# Patient Record
Sex: Female | Born: 1990 | Race: White | Hispanic: No | Marital: Married | Smoking: Former smoker
Health system: Southern US, Community
[De-identification: ages and names within clinical notes are randomized; demographics above are authoritative.]

## PROBLEM LIST (undated history)

## (undated) ENCOUNTER — Inpatient Hospital Stay (HOSPITAL_COMMUNITY): Payer: Self-pay

## (undated) DIAGNOSIS — F99 Mental disorder, not otherwise specified: Secondary | ICD-10-CM

## (undated) DIAGNOSIS — A6000 Herpesviral infection of urogenital system, unspecified: Secondary | ICD-10-CM

## (undated) HISTORY — PX: NO PAST SURGERIES: SHX2092

## (undated) HISTORY — DX: Mental disorder, not otherwise specified: F99

## (undated) HISTORY — DX: Herpesviral infection of urogenital system, unspecified: A60.00

---

## 2015-07-13 ENCOUNTER — Other Ambulatory Visit (HOSPITAL_COMMUNITY): Payer: Self-pay | Admitting: Unknown Physician Specialty

## 2015-07-13 ENCOUNTER — Ambulatory Visit (HOSPITAL_COMMUNITY)
Admission: RE | Admit: 2015-07-13 | Discharge: 2015-07-13 | Disposition: A | Discharge status: Home / Self Care | Payer: Self-pay | Source: Ambulatory Visit | Attending: Unknown Physician Specialty | Admitting: Unknown Physician Specialty

## 2015-07-13 ENCOUNTER — Encounter (HOSPITAL_COMMUNITY): Payer: Self-pay

## 2015-07-13 DIAGNOSIS — Z315 Encounter for genetic counseling: Secondary | ICD-10-CM | POA: Insufficient documentation

## 2015-07-13 DIAGNOSIS — IMO0001 Reserved for inherently not codable concepts without codable children: Secondary | ICD-10-CM

## 2015-07-13 DIAGNOSIS — Z3689 Encounter for other specified antenatal screening: Secondary | ICD-10-CM

## 2015-07-13 DIAGNOSIS — Z3A16 16 weeks gestation of pregnancy: Secondary | ICD-10-CM

## 2015-07-13 DIAGNOSIS — O358XX0 Maternal care for other (suspected) fetal abnormality and damage, not applicable or unspecified: Principal | ICD-10-CM

## 2015-07-13 DIAGNOSIS — O283 Abnormal ultrasonic finding on antenatal screening of mother: Secondary | ICD-10-CM | POA: Insufficient documentation

## 2015-07-13 DIAGNOSIS — O351XX0 Maternal care for (suspected) chromosomal abnormality in fetus, not applicable or unspecified: Secondary | ICD-10-CM

## 2015-07-13 DIAGNOSIS — O289 Unspecified abnormal findings on antenatal screening of mother: Secondary | ICD-10-CM

## 2015-07-13 DIAGNOSIS — O3510X0 Maternal care for (suspected) chromosomal abnormality in fetus, unspecified, not applicable or unspecified: Secondary | ICD-10-CM

## 2015-07-15 DIAGNOSIS — IMO0002 Reserved for concepts with insufficient information to code with codable children: Secondary | ICD-10-CM | POA: Insufficient documentation

## 2015-07-15 DIAGNOSIS — O351XX0 Maternal care for (suspected) chromosomal abnormality in fetus, not applicable or unspecified: Secondary | ICD-10-CM | POA: Insufficient documentation

## 2015-07-15 DIAGNOSIS — O3510X0 Maternal care for (suspected) chromosomal abnormality in fetus, unspecified, not applicable or unspecified: Secondary | ICD-10-CM | POA: Insufficient documentation

## 2015-07-15 NOTE — Progress Notes (Signed)
Genetic Counseling  High-Risk Gestation Note  Appointment Date:  07/13/2015 Referred By: [name], MD Date of Birth:  1990/12/28 Partner:  [name]   Pregnancy History: [id number] Estimated Date of Delivery: 12/24/15 Estimated Gestational Age: 47w4dAttending: MRenella Cunas MD   I met with Ms. COple Girgisand her partner, Mr. JMartiniqueBarker, for genetic counseling because of abnormal ultrasound findings and high risk Turner syndrome (monosomy X) result from noninvasive prenatal screening (NIPS)/cell free DNA testing.  In Summary:   Cystic hygroma visualized in first trimester at patient's OB office and NIPS (Panorama) performed at that time  Panorama result consistent with high risk for monosomy X (Turner syndrome)  Ultrasound today visualized very large cystic hygroma with hydrops: bilateral pleural effusions with mediastinal shift, ascites, generalized skin edema, single umbilical artery and echogenic bowel  Couple declined additional chromosome testing (amniocentesis) at this time; They are aware that postnatal karyotype analysis (including POC testing) could be performed  Discussed very poor prognosis given ultrasound findings and given that approximately 99% of pregnancies with Turnery syndrome result in first or second trimester loss  Couple understandably very upset by information today  Plan to follow-up with OB provider  We discussed the results of ultrasound today. Ultrasound visualized very large cystic hygroma with hydrops: bilateral pleural effusions with mediastinal shift, ascites, generalized skin edema, single umbilical artery and echogenic bowel. Complete ultrasound results are reported separately. A cystic hygroma describes a septated fluid filled sac at the back of the neck that typically results from failure of the fetal lymphatic system.  Various etiologies for a hygroma including normal variation (immature lymphatic system), a chromosome condition,  single gene condition, or a congenital anomaly (heart defect). Cystic hygromas can progress to hydrops fetalis, which is associated with a very poor prognosis and likely fetal demise. We reviewed that given the ultrasound findings today, fetal demise is very likely for the pregnancy.   Ms. CFredrickpreviously had noninvasive prenatal screening (NIPS)/cell free DNA testing performed through her OB office. These results[facility], were consistent with a high risk for monosomy X (Turner syndrome). NIPS assesses cell free DNA that is placental in origin and while highly specific and sensitive, it is not considered diagnostic. However, given the ultrasound findings, the positive predictive value of this result is estimated to be 99%.   We briefly reviewed chromosomes. The patient declined detailed discussion at this time. They understand that Turner syndrome occurs when a girl receives only one copy of an X chromosome instead of the expected two copies. The missing X chromosome is present from the time of conception, and could have resulted from maternal meiotic nondisjunction, or more commonly (80%) from paternal nondisjunction. We discussed that Turner syndrome is a sporadic condition and is not the result of anything that a parent did or did not do. Turner syndrome is a multisystem disorder occuring in approximately 1 in 238,000female live births. Because typically every cell of the body is missing an X chromosome, virtually all organ systems can be affected. Lymphedema, cardiac and renal anomalies, short stature, developmental delay, and infertility due to ovarian dysfunction are commonly described in girls with this condition.   We discussed that pregnancies with Turner syndrome have an increased risk for miscarriage, most often in the first or second trimester, or stillbirth (>99% of cases). The risk for neonatal death is also increased.   We briefly discussed that NIPS does not  distinguish between different types of Turner syndrome. This couple was  briefly counseled regarding the availability of diagnostic testing via amniocentesis or postnatal confirmation. [name] declined amniocentesis. Options for the pregnancy include termination of pregnancy or continuing the pregnancy with expectant management. The couple planned to continue to monitor the pregnancy and plan to follow-up with her OB.   Once a couple has had one pregnancy with Turner syndrome, the risk of recurrence for a future pregnancy does not appear to be increased above the risk of the general population. Additionally, the chance to have a pregnancy with Turner syndrome does not increase with maternal age as it does with other extra chromosome conditions, such as Down syndrome. To summarize, this couple was counseled that they likely do not have an increased risk to have another pregnancy with Turner syndrome, if the diagnosis is confirmed in the current pregnancy. However, chromosome analysis is needed to confirm the diagnosis and determine whether it was likely sporadic or due to a different underlying mechanism.   The patient declined detailed family history intake and discussion today given the nature of today's visit and given that the couple was understandably upset today. She reported no family history of birth defects or known genetic conditions. Without further information regarding the provided family history, an accurate genetic risk cannot be calculated. Further genetic counseling is warranted if more information is obtained.  Carolyn Coffey was provided with written information regarding cystic fibrosis (CF) including the carrier frequency and incidence in the Caucasian population, the availability of carrier testing and prenatal diagnosis if indicated.  In addition, we discussed that CF is routinely screened for as part of the [place] newborn screening panel.  She declined discussion of CF carrier  screening today.   Carolyn Coffey denied exposure to environmental toxins or chemical agents. She denied the use of alcohol, tobacco or street drugs. She denied significant viral illnesses during the course of her pregnancy. Her medical and surgical histories were noncontributory.   I counseled this couple regarding the above risks and available options.  The approximate face-to-face time with the genetic counselor was 25 minutes.  Chipper Oman, MS Certified Genetic Counselor 07/15/2015

## 2015-07-16 ENCOUNTER — Encounter (HOSPITAL_COMMUNITY): Payer: Self-pay

## 2015-07-16 ENCOUNTER — Other Ambulatory Visit (HOSPITAL_COMMUNITY): Payer: Self-pay

## 2016-05-17 ENCOUNTER — Encounter (HOSPITAL_COMMUNITY): Payer: Self-pay

## 2017-10-19 IMAGING — US US MFM OB DETAIL+14 WK
1 series · 14 of 28 positions shown · non-contrast
Comparison: none

[Series 2: us mfm ob detail+14 wk · 57 acquisitions, 14 frames shown]
[im 3/57]
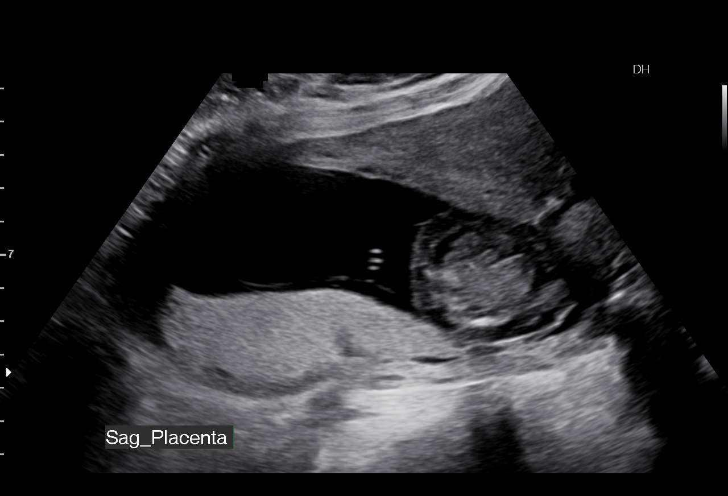
[im 7/57]
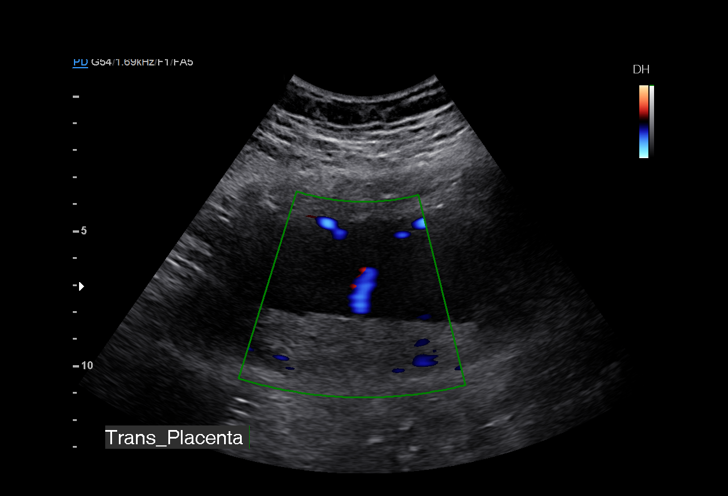
[im 11/57]
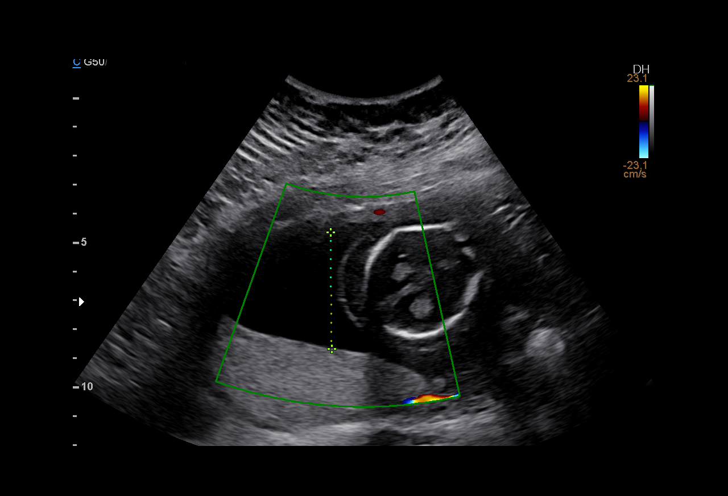
[im 15/57]
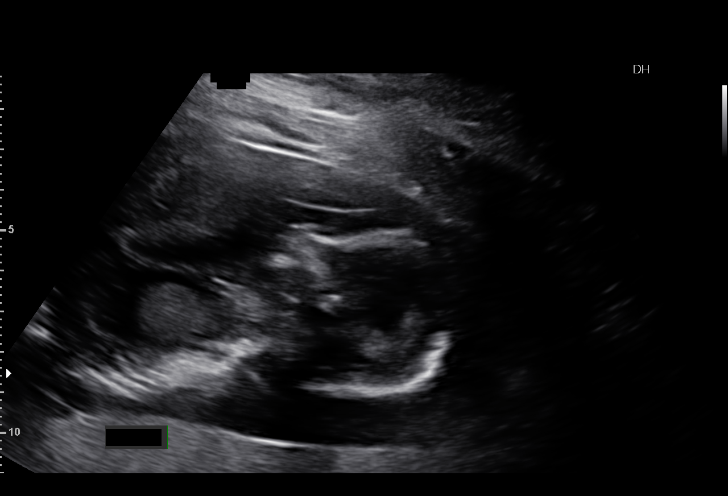
[im 19/57]
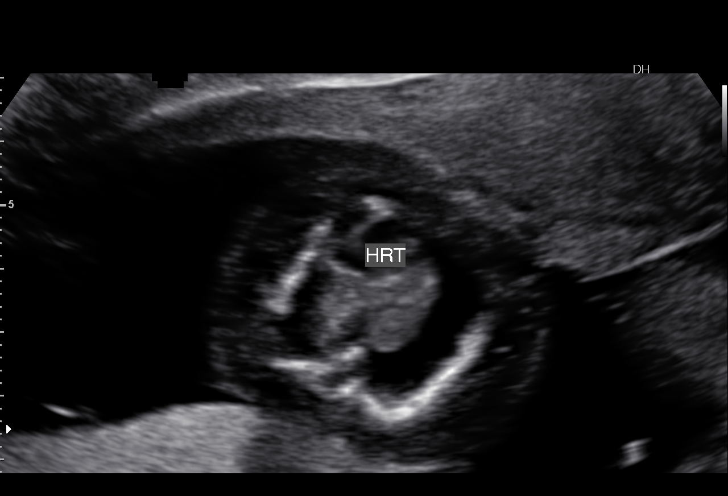
[im 23/57]
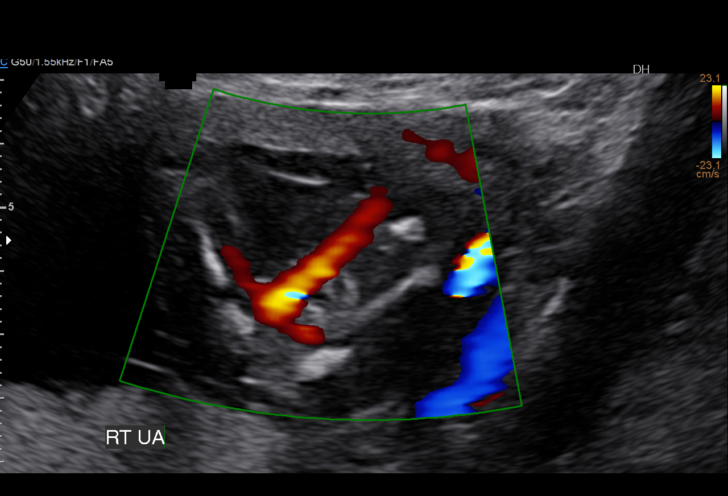
[im 27/57]
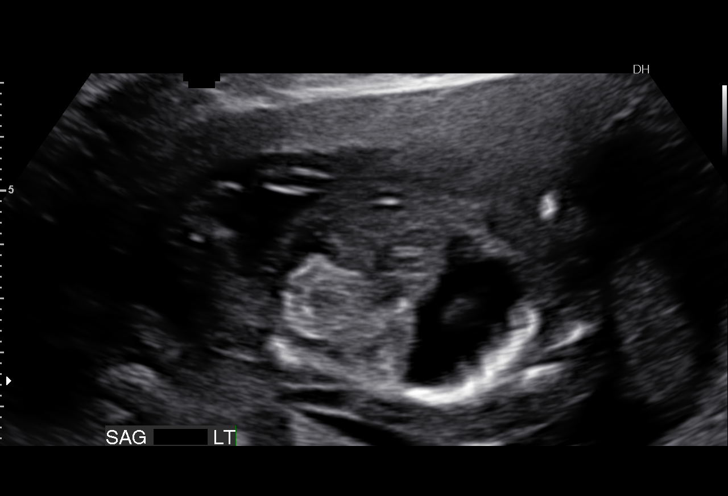
[im 32/57]
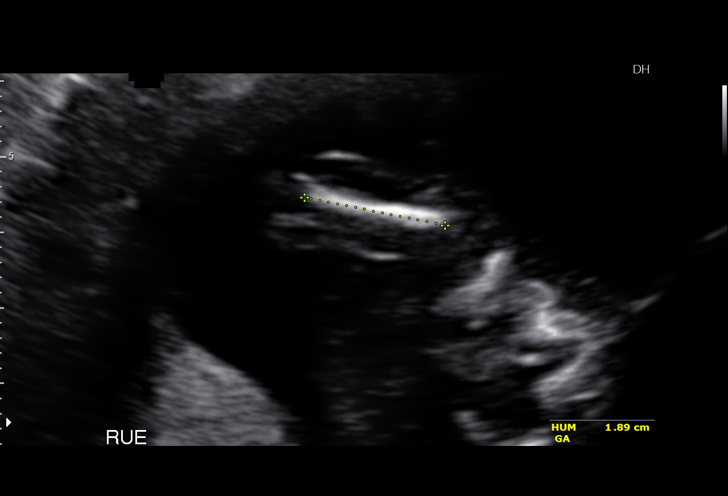
[im 36/57]
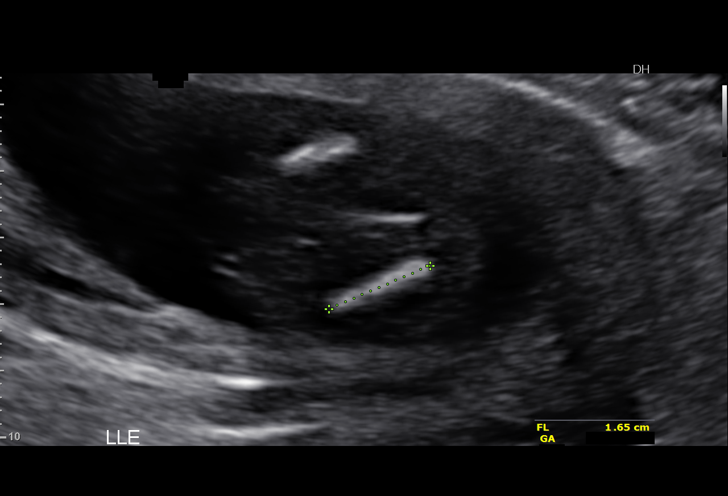
[im 40/57]
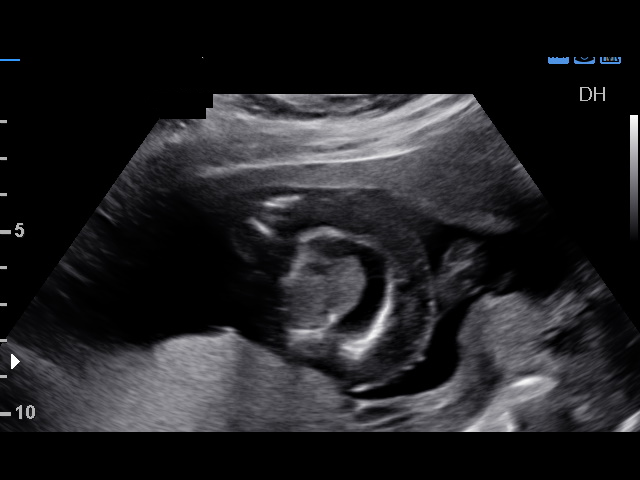
[im 44/57]
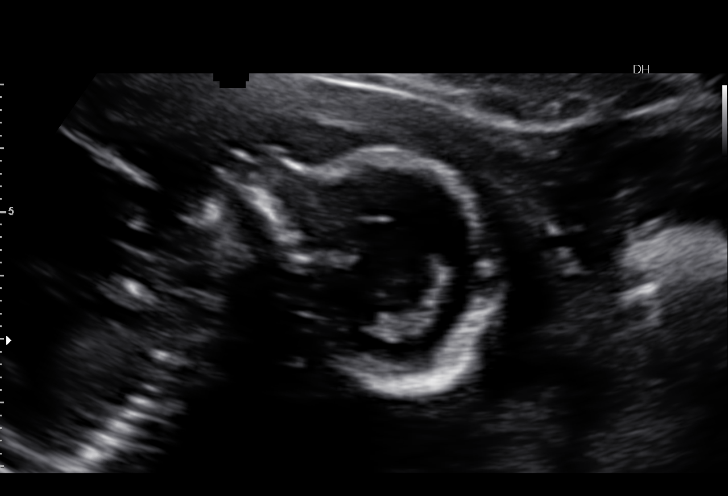
[im 48/57]
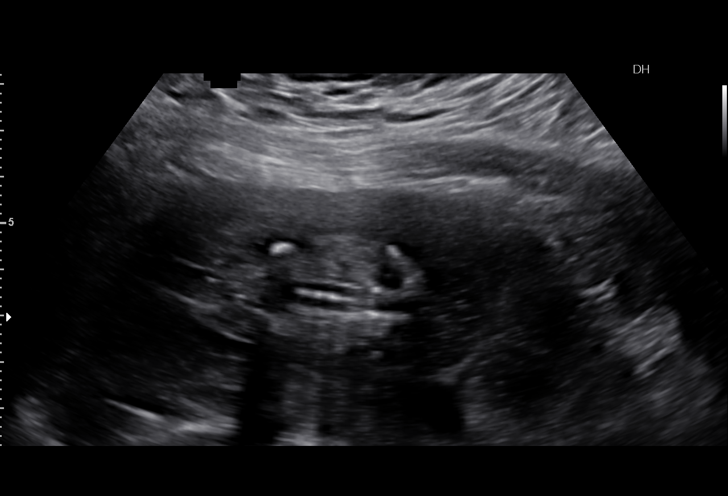
[im 52/57]
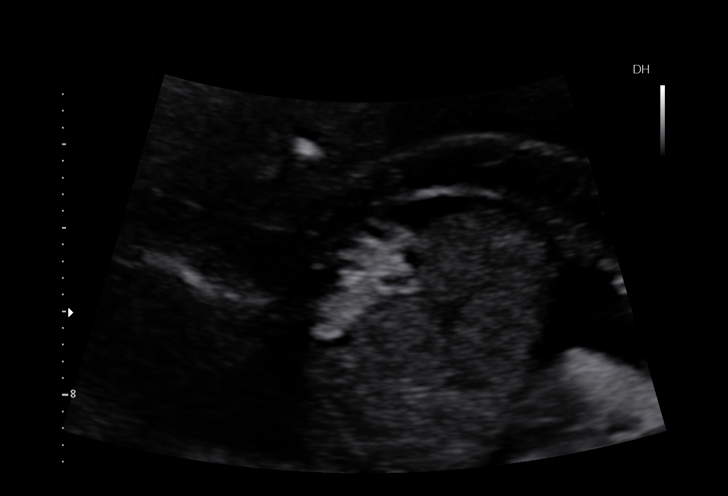
[im 57/57]
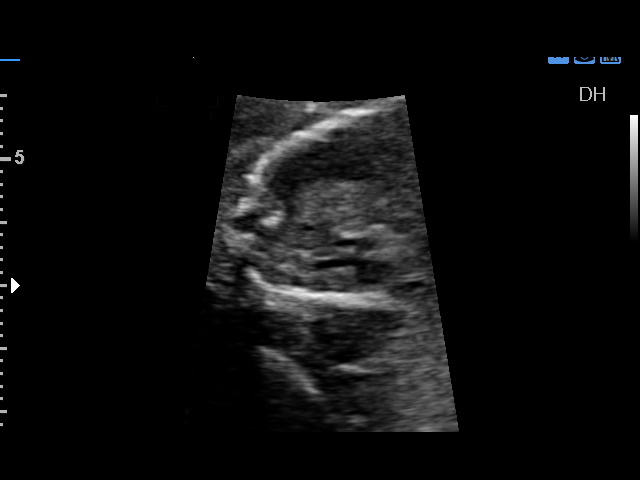

[14 of 28 positions shown; findings below may reference images not displayed]

am)

Date:

[REDACTED] 03033

1  Sericka Loveable           [id number]       [id number]     [id number]
Indications

16 weeks gestation of pregnancy
Detailed fetal anatomic survey                  Z36
Abnormal finding on antenatal screening;
high risk by NIPS for 45X
Cystic hygroma
OB History

Blood Type:    A-
Gravidity:     3         Term:  2        Prem:    0        SAB:   0
TOP:           0       Ectopic  0        Living:  2
:
Fetal Evaluation

Num Of Fetuses:      1
Fetal Heart          161
Rate(bpm):
Cardiac Activity:    Observed
Presentation:        Transverse, head to maternal left
Placenta:            Posterior, above cervical os
P. Cord Insertion:   Marginal insertion

Amniotic Fluid
AFI FV:      Subjectively within normal limits
Larg Pckt:      4.0  cm
Biometry

BPD:      37.3  mm     G. Age:   17w 3d                  CI:        80.18   %    70 - 86
FL/HC:      12.5   %    14.6 -
HC:      131.6  mm     G. Age:   16w 5d        48   %    HC/AC:      0.86        1.07 -
AC:      152.4  mm     G. Age:   20w 3d      > 97   %    FL/BPD      44.2   %
:
FL:       16.5  mm     G. Age:   14w 6d       < 3   %    FL/AC:      10.8   %    20 - 24
HUM:      19.4  mm     G. Age:   15w 5d        30   %
CER:      15.1  mm     G. Age:   15w 1d        17   %
CM:        2.4  mm

Est.         206   gm    0 lb 7 oz    > 75   %
FW:
Gestational Age

LMP:           16w 4d        Date:  03/19/15                  EDD:   12/24/15
U/S Today:     17w 3d                                         EDD:   12/18/15
Best:          16w 4d    Det. By:   LMP  (03/19/15)           EDD:   12/24/15
Anatomy

Cranium:          Appears normal         Aortic Arch:       Not well visualized
Fetal Cavum:      Not well visualized    Ductal Arch:       Not well visualized
Ventricles:       Appears normal         Diaphragm:         Not well visualized
Choroid Plexus:   Appears normal         Stomach:           Appears normal,
left sided
Cerebellum:       Appears normal         Abdomen:           Echogenic Bowel
Posterior         Appears normal         Abdominal          Not well visualized
Fossa:                                   Wall:
Nuchal Fold:      Cystic hygroma         Cord Vessels:      2 vessel cord,
absent FAXX TIGER
Jimmy Charania
Face:             Profile nl; orbits not Kidneys:           Appear normal
well visualized
Lips:             Not well visualized    Bladder:           Not well visualized
Fetal Thoracic:   Pleural effusion       Spine:             Not well visualized
Heart:            Not well visualized    Upper              Visualized
Extremities:
RVOT:             Not well visualized    Lower              Visualized
Extremities:
LVOT:             Not well visualized

Other:   Technically difficult due to early gestational age.
Cervix Uterus Adnexa

Cervix
Length:             3.3  cm.
Normal appearance by transabdominal scan.

Left Ovary
Within normal limits.
Right Ovary
Within normal limits.

Adnexa:       No abnormality visualized.
Impression

SIUP at 16+4 weeks
Very large cystic hygroma with hydrops: bilateral pleural
effusions with mediastinal shift, ascites, generalized skin
edema, single umbilical artery and echogenic bowel
Normal amniotic fluid volume
Measurements consistent with LMP dating

The US findings were shared with Ms. Ullman. The
implications of the above abnormalities were discussed in
detail. Unfortunately, the prognosis is dismal. She also
received genetic counseling. All of her prenatal testing and
pregnancy management options were reviewed. After
careful consideration, she declined amniocentesis and all
other testing. She plans to continue the pregnancy until the
fetus dies.
Recommendations

Follow-up ultrasounds as clinically indicated.

## 2018-01-02 ENCOUNTER — Ambulatory Visit (INDEPENDENT_AMBULATORY_CARE_PROVIDER_SITE_OTHER): Payer: Self-pay | Admitting: Adult Health

## 2018-01-02 ENCOUNTER — Other Ambulatory Visit: Payer: Self-pay

## 2018-01-02 ENCOUNTER — Encounter: Payer: Self-pay | Admitting: Adult Health

## 2018-01-02 VITALS — BP 130/77 | HR 78 | Ht 70.0 in | Wt 263.0 lb

## 2018-01-02 DIAGNOSIS — Z3A15 15 weeks gestation of pregnancy: Secondary | ICD-10-CM

## 2018-01-02 DIAGNOSIS — Z3201 Encounter for pregnancy test, result positive: Secondary | ICD-10-CM | POA: Insufficient documentation

## 2018-01-02 DIAGNOSIS — Z8759 Personal history of other complications of pregnancy, childbirth and the puerperium: Secondary | ICD-10-CM

## 2018-01-02 DIAGNOSIS — Z363 Encounter for antenatal screening for malformations: Secondary | ICD-10-CM | POA: Insufficient documentation

## 2018-01-02 DIAGNOSIS — N926 Irregular menstruation, unspecified: Secondary | ICD-10-CM

## 2018-01-02 LAB — POCT URINE PREGNANCY: Preg Test, Ur: POSITIVE — AB

## 2018-01-02 NOTE — Patient Instructions (Signed)
Second Trimester of Pregnancy The second trimester is from week 13 through week 28, month 4 through 6. This is often the time in pregnancy that you feel your best. Often times, morning sickness has lessened or quit. You may have more energy, and you may get hungry more often. Your unborn baby (fetus) is growing rapidly. At the end of the sixth month, he or she is about 9 inches long and weighs about 1 pounds. You will likely feel the baby move (quickening) between 18 and 20 weeks of pregnancy. Follow these instructions at home:  Avoid all smoking, herbs, and alcohol. Avoid drugs not approved by your doctor.  Do not use any tobacco products, including cigarettes, chewing tobacco, and electronic cigarettes. If you need help quitting, ask your doctor. You may get counseling or other support to help you quit.  Only take medicine as told by your doctor. Some medicines are safe and some are not during pregnancy.  Exercise only as told by your doctor. Stop exercising if you start having cramps.  Eat regular, healthy meals.  Wear a good support bra if your breasts are tender.  Do not use hot tubs, steam rooms, or saunas.  Wear your seat belt when driving.  Avoid raw meat, uncooked cheese, and liter boxes and soil used by cats.  Take your prenatal vitamins.  Take 1500-2000 milligrams of calcium daily starting at the 20th week of pregnancy until you deliver your baby.  Try taking medicine that helps you poop (stool softener) as needed, and if your doctor approves. Eat more fiber by eating fresh fruit, vegetables, and whole grains. Drink enough fluids to keep your pee (urine) clear or pale yellow.  Take warm water baths (sitz baths) to soothe pain or discomfort caused by hemorrhoids. Use hemorrhoid cream if your doctor approves.  If you have puffy, bulging veins (varicose veins), wear support hose. Raise (elevate) your feet for 15 minutes, 3-4 times a day. Limit salt in your diet.  Avoid heavy  lifting, wear low heals, and sit up straight.  Rest with your legs raised if you have leg cramps or low back pain.  Visit your dentist if you have not gone during your pregnancy. Use a soft toothbrush to brush your teeth. Be gentle when you floss.  You can have sex (intercourse) unless your doctor tells you not to.  Go to your doctor visits. Get help if:  You feel dizzy.  You have mild cramps or pressure in your lower belly (abdomen).  You have a nagging pain in your belly area.  You continue to feel sick to your stomach (nauseous), throw up (vomit), or have watery poop (diarrhea).  You have bad smelling fluid coming from your vagina.  You have pain with peeing (urination). Get help right away if:  You have a fever.  You are leaking fluid from your vagina.  You have spotting or bleeding from your vagina.  You have severe belly cramping or pain.  You lose or gain weight rapidly.  You have trouble catching your breath and have chest pain.  You notice sudden or extreme puffiness (swelling) of your face, hands, ankles, feet, or legs.  You have not felt the baby move in over an hour.  You have severe headaches that do not go away with medicine.  You have vision changes. This information is not intended to replace advice given to you by your health care provider. Make sure you discuss any questions you have with your health care   provider. Document Released: 09/06/2009 Document Revised: 11/18/2015 Document Reviewed: 08/13/2012 Elsevier Interactive Patient Education  2017 Elsevier Inc.  

## 2018-01-02 NOTE — Progress Notes (Signed)
  Subjective:     Patient ID: Carolyn Coffey, female   DOB: Nov 15, 1990, 27 y.o.   MRN: [id number]  HPI Carolyn Coffey is a 27 year old, white female, single, in for UPT, has missed a period and had +HPTs.She had still born in 2017 at 26 weeks, that had Turner's Syndrome.   Review of Systems +missed period with 3-4 +HPTs Has abscessed tooth, on Amoxil and has norco if needed  Reviewed past medical,surgical, social and family history. Reviewed medications and allergies.     Objective:   Physical Exam BP 130/77 (BP Location: Left Arm, Patient Position: Sitting, Cuff Size: Large)   Pulse 78   Ht 5\' 10"  (1.778 m)   Wt 263 lb (119.3 kg)   LMP 09/18/2017 (Approximate)   BMI 37.74 kg/m UPT +, about 15 +1 week by LMP with EDD 06/25/18.Skin warm and dry. Neck: mid line trachea, normal thyroid, good ROM, no lymphadenopathy noted. Lungs: clear to ausculation bilaterally. Cardiovascular: regular rate and rhythm.Abdomen is soft and non tender, FHR 141 via doppler. PHQ 2 score 0.  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     Assessment:     1. Positive pregnancy test   2. [redacted] weeks gestation of pregnancy   3. Antenatal screening for malformation using ultrasonics       Plan:     Continue PNV Return in about 2 weeks for anatomy US, if far enough along Review handouts on second trimester and by [facility]

## 2018-01-16 ENCOUNTER — Ambulatory Visit (INDEPENDENT_AMBULATORY_CARE_PROVIDER_SITE_OTHER): Payer: Self-pay

## 2018-01-16 DIAGNOSIS — Z363 Encounter for antenatal screening for malformations: Secondary | ICD-10-CM

## 2018-01-16 NOTE — Progress Notes (Signed)
US 25+6 wks,cephalic,cx 3.7 cm,normal ovaries bilat,posterior pl gr 1,svp of fluid 4.8 cm,fhr 140 bpm,efw 895 g,anatomy complete,no obvious abnormalities

## 2018-01-30 ENCOUNTER — Encounter: Payer: Self-pay | Admitting: Advanced Practice Midwife

## 2018-01-30 ENCOUNTER — Other Ambulatory Visit: Payer: Self-pay

## 2018-01-30 ENCOUNTER — Other Ambulatory Visit (HOSPITAL_COMMUNITY)
Admission: RE | Admit: 2018-01-30 | Discharge: 2018-01-30 | Disposition: A | Discharge status: Home / Self Care | Payer: Medicaid - Out of State | Source: Ambulatory Visit | Attending: Advanced Practice Midwife | Admitting: Advanced Practice Midwife

## 2018-01-30 ENCOUNTER — Ambulatory Visit (INDEPENDENT_AMBULATORY_CARE_PROVIDER_SITE_OTHER): Payer: Self-pay | Admitting: Advanced Practice Midwife

## 2018-01-30 VITALS — BP 123/75 | HR 77 | Wt 260.0 lb

## 2018-01-30 DIAGNOSIS — O0930 Supervision of pregnancy with insufficient antenatal care, unspecified trimester: Secondary | ICD-10-CM | POA: Insufficient documentation

## 2018-01-30 DIAGNOSIS — Z3482 Encounter for supervision of other normal pregnancy, second trimester: Secondary | ICD-10-CM | POA: Diagnosis present

## 2018-01-30 DIAGNOSIS — Z3490 Encounter for supervision of normal pregnancy, unspecified, unspecified trimester: Secondary | ICD-10-CM | POA: Insufficient documentation

## 2018-01-30 DIAGNOSIS — Z3A15 15 weeks gestation of pregnancy: Secondary | ICD-10-CM | POA: Insufficient documentation

## 2018-01-30 DIAGNOSIS — A6004 Herpesviral vulvovaginitis: Secondary | ICD-10-CM

## 2018-01-30 DIAGNOSIS — Z1151 Encounter for screening for human papillomavirus (HPV): Secondary | ICD-10-CM | POA: Diagnosis not present

## 2018-01-30 DIAGNOSIS — O282 Abnormal cytological finding on antenatal screening of mother: Secondary | ICD-10-CM | POA: Insufficient documentation

## 2018-01-30 DIAGNOSIS — Z124 Encounter for screening for malignant neoplasm of cervix: Secondary | ICD-10-CM

## 2018-01-30 DIAGNOSIS — O98312 Other infections with a predominantly sexual mode of transmission complicating pregnancy, second trimester: Secondary | ICD-10-CM

## 2018-01-30 DIAGNOSIS — Z131 Encounter for screening for diabetes mellitus: Secondary | ICD-10-CM

## 2018-01-30 DIAGNOSIS — Z23 Encounter for immunization: Secondary | ICD-10-CM

## 2018-01-30 DIAGNOSIS — Z1389 Encounter for screening for other disorder: Secondary | ICD-10-CM

## 2018-01-30 DIAGNOSIS — A6000 Herpesviral infection of urogenital system, unspecified: Secondary | ICD-10-CM | POA: Insufficient documentation

## 2018-01-30 DIAGNOSIS — Z331 Pregnant state, incidental: Secondary | ICD-10-CM

## 2018-01-30 DIAGNOSIS — Z3A26 26 weeks gestation of pregnancy: Secondary | ICD-10-CM

## 2018-01-30 LAB — POCT URINALYSIS DIPSTICK OB
Blood, UA: NEGATIVE
GLUCOSE, UA: NEGATIVE — AB
Ketones, UA: NEGATIVE
LEUKOCYTES UA: NEGATIVE
Nitrite, UA: NEGATIVE
POC,PROTEIN,UA: NEGATIVE

## 2018-01-30 NOTE — Patient Instructions (Signed)
Safe Medications in Pregnancy   Acne: Benzoyl Peroxide Salicylic Acid  Backache/Headache: Tylenol: 2 regular strength every 4 hours OR              2 Extra strength every 6 hours  Colds/Coughs/Allergies: Benadryl (alcohol free) 25 mg every 6 hours as needed Breath right strips Claritin Cepacol throat lozenges Chloraseptic throat spray Cold-Eeze- up to three times per day Cough drops, alcohol free Flonase (by prescription only) Guaifenesin Mucinex Robitussin DM (plain only, alcohol free) Saline nasal spray/drops Sudafed (pseudoephedrine) & Actifed ** use only after [redacted] weeks gestation and if you do not have high blood pressure Tylenol Vicks Vaporub Zinc lozenges Zyrtec   Constipation: Colace Ducolax suppositories Fleet enema Glycerin suppositories Metamucil Milk of magnesia Miralax Senokot Smooth move tea  Diarrhea: Kaopectate Imodium A-D  *NO pepto Bismol  Hemorrhoids: Anusol Anusol HC Preparation H Tucks  Indigestion: Tums Maalox Mylanta Zantac  Pepcid  Insomnia: Benadryl (alcohol free) 25mg  every 6 hours as needed Tylenol PM Unisom, no Gelcaps  Leg Cramps: Tums MagGel  Nausea/Vomiting:  Bonine Dramamine Emetrol Ginger extract Sea bands Meclizine  Nausea medication to take during pregnancy:  Unisom (doxylamine succinate 25 mg tablets) Take one tablet daily at bedtime. If symptoms are not adequately controlled, the dose can be increased to a maximum recommended dose of two tablets daily (1/2 tablet in the morning, 1/2 tablet mid-afternoon and one at bedtime). Vitamin B6 100mg  tablets. Take one tablet twice a day (up to 200 mg per day).  Skin Rashes: Aveeno products Benadryl cream or 25mg  every 6 hours as needed Calamine Lotion 1% cortisone cream  Yeast infection: Gyne-lotrimin 7 Monistat 7   **If taking multiple medications, please check labels to avoid duplicating the same active ingredients **take medication as directed on  the label ** Do not exceed 4000 mg of tylenol in 24 hours **Do not take medications that contain aspirin or ibuprof 1. Before your test, do not eat or drink anything for 8-10 hours prior to your  appointment (a small amount of water is allowed and you may take any medicines you normally take). Be sure to drink lots of water the day before. 2. When you arrive, your blood will be drawn for a 'fasting' blood sugar level.  Then you will be given a sweetened carbonated beverage to drink. You should  complete drinking this beverage within five minutes. After finishing the  beverage, you will have your blood drawn exactly 1 and 2 hours later. Having  your blood drawn on time is an important part of this test. A total of three blood  samples will be done. 3. The test takes approximately 2  hours. During the test, do not have anything to  eat or drink. Do not smoke, chew gum (not even sugarless gum) or use breath mints.  4. During the test you should remain close by and seated as much as possible and  avoid walking around. You may want to bring a book or something else to  occupy your time.  5. After your test, you may eat and drink as normal. You may want to bring a snack  to eat after the test is finished. Your provider will advise you as to the results of  this test and any follow-up if necessary  If your sugar test is positive for gestational diabetes, you will be given an phone call and further instructions discussed. If you wish to know all of your test results before your next appointment, feel  free to call the office, or look up your test results on Mychart.  (The range that the lab uses for normal values of the sugar test are not necessarily the range that is used for pregnant women; if your results are within the normal range, they are definitely normal.  However, if a value is deemed "high" by the lab, it may not be too high for a pregnant woman.  We will need to discuss the results if your  value(s) fall in the "high" category).     Tdap Vaccine  It is recommended that you get the Tdap vaccine during the third trimester of EACH pregnancy to help protect your baby from getting pertussis (whooping cough)  27-36 weeks is the BEST time to do this so that you can pass the protection on to your baby. During pregnancy is better than after pregnancy, but if you are unable to get it during pregnancy it will be offered at the hospital.  You will be offered this vaccine in the office after 27 weeks.  If you do not have health insurance, you can get the vaccine from the [facility] Department (no appointment needed).   Everyone who will be around your baby should also be up-to-date on their vaccines. Adults (who are not pregnant) only need 1 dose of Tdap during adulthood.

## 2018-01-30 NOTE — Progress Notes (Signed)
Subjective:    Carolyn Coffey is a Carolyn Coffey [redacted]w[redacted]d being seen today for her first obstetrical visit.  Her obstetrical history is significant for 2 early SABs, 3 FT (one macrosomic @ 9#10oz, denies GDM) SVDs, one PTD @ 27 weeks d/t IUFD/Turner's syndroms.  Pregnancy history fully reviewed. Late PNC, EDC based on 26 week [place] (2 months difference from LMP)  Patient reports ? BV sx (yellow dc).  Vitals:   01/30/18 1122  BP: 123/75  Pulse: 77  Weight: 260 lb (117.9 kg)    HISTORY: OB History  Gravida Para Term Preterm AB Living  7 4 3 1 2 3   SAB TAB Ectopic Multiple Live Births  2       3    # Outcome Date GA Lbr Len/2nd Weight Sex Delivery Anes PTL Lv  7 Current           6 Term 12/11/16   9 lb 10 oz (4.366 kg) M Vag-Spont   LIV  5 SAB 10/2015          4 Preterm 09/18/15 [redacted]w[redacted]d   F Vag-Spont  Y FD     Birth Comments: Turner Syndrome  3 SAB 2016          2 Term 10/18/11   7 lb 10 oz (3.459 kg) F Vag-Spont   LIV  1 Term 09/15/09   7 lb 5 oz (3.317 kg) F Vag-Spont   LIV   Past Medical History:  Diagnosis Date  . Genital herpes   . Mental disorder    anxiety   Past Surgical History:  Procedure Laterality Date  . NO PAST SURGERIES     Family History  Problem Relation Age of Onset  . Cancer Paternal Grandfather        skin  . Hypertension Paternal Grandmother   . Hypertension Maternal Grandmother   . Diabetes Maternal Grandfather   . Colon cancer Father   . Turner syndrome Daughter      Exam       Pelvic Exam:    Perineum: Normal Perineum   Vulva: normal   Vagina:  normal mucosa, normal appearing discharge, no palpable nodules   Uterus Normal, Gravid, FH: 30     Cervix: normal   Adnexa: Not palpable   Urinary:  urethral meatus normal    System:     Skin: normal coloration and turgor, no rashes    Neurologic: oriented, normal, normal mood   Extremities: normal strength, tone, and muscle mass   HEENT PERRLA   Mouth/Teeth mucous membranes moist, normal  dentition   Neck supple and no masses   Cardiovascular: regular rate and rhythm   Respiratory:  appears well, vitals normal, no respiratory distress, acyanotic   Abdomen: soft, non-tender;  FHR: 160        The nature of [facility] with multiple MDs and other Advanced Practice Providers was explained to patient; also emphasized that residents, students are part of our team.  Assessment:    Pregnancy: Carolyn Coffey Patient Active Problem List   Diagnosis Date Noted  . Supervision of normal pregnancy 01/30/2018  . Antenatal screening for malformation using ultrasonics 01/02/2018  . Suspected fetal chromosome anomaly affecting antepartum care of mother 07/15/2015  . Fetal cystic hygroma 07/15/2015        Plan:     Initial labs drawn. Continue prenatal vitamins  Problem list reviewed and updated  Reviewed n/v relief measures and warning s/s to report  Reviewed recommended weight gain based on pre-gravid BMI  Encouraged well-balanced diet Genetic Screening discussed CfDNA:  accepted .  Ultrasound discussed; fetal survey: results reviewed.  No follow-ups on file.  Jacklyn ShellFrances Cresenzo-Dishmon 01/30/2018

## 2018-01-31 LAB — PMP SCREEN PROFILE (10S), URINE
AMPHETAMINE SCREEN URINE: NEGATIVE ng/mL
BARBITURATE SCREEN URINE: NEGATIVE ng/mL
BENZODIAZEPINE SCREEN, URINE: NEGATIVE ng/mL
CANNABINOIDS UR QL SCN: NEGATIVE ng/mL
COCAINE(METAB.)SCREEN, URINE: NEGATIVE ng/mL
Creatinine(Crt), U: 42.4 mg/dL (ref 20.0–300.0)
METHADONE SCREEN, URINE: NEGATIVE ng/mL
OXYCODONE+OXYMORPHONE UR QL SCN: NEGATIVE ng/mL
Opiate Scrn, Ur: NEGATIVE ng/mL
PH UR, DRUG SCRN: 8.1 (ref 4.5–8.9)
PHENCYCLIDINE QUANTITATIVE URINE: NEGATIVE ng/mL
Propoxyphene Scrn, Ur: NEGATIVE ng/mL

## 2018-01-31 LAB — MED LIST OPTION NOT SELECTED

## 2018-02-01 LAB — URINE CULTURE

## 2018-02-04 ENCOUNTER — Other Ambulatory Visit: Payer: Self-pay

## 2018-02-04 LAB — CYTOLOGY - PAP
Chlamydia: NEGATIVE
Diagnosis: UNDETERMINED — AB
HPV (WINDOPATH): DETECTED — AB
NEISSERIA GONORRHEA: NEGATIVE

## 2018-02-05 LAB — URINALYSIS, ROUTINE W REFLEX MICROSCOPIC
Bilirubin, UA: NEGATIVE
Glucose, UA: NEGATIVE
Ketones, UA: NEGATIVE
LEUKOCYTES UA: NEGATIVE
Nitrite, UA: NEGATIVE
PROTEIN UA: NEGATIVE
RBC, UA: NEGATIVE
Specific Gravity, UA: 1.02 (ref 1.005–1.030)
Urobilinogen, Ur: 0.2 mg/dL (ref 0.2–1.0)
pH, UA: >=9 — AB (ref 5.0–7.5)

## 2018-02-05 LAB — OBSTETRIC PANEL, INCLUDING HIV
ANTIBODY SCREEN: NEGATIVE
Basophils Absolute: 0 10*3/uL (ref 0.0–0.2)
Basos: 0 %
EOS (ABSOLUTE): 0.1 10*3/uL (ref 0.0–0.4)
EOS: 1 %
HIV SCREEN 4TH GENERATION: NONREACTIVE
Hematocrit: 31.7 % — ABNORMAL LOW (ref 34.0–46.6)
Hemoglobin: 10.5 g/dL — ABNORMAL LOW (ref 11.1–15.9)
Hepatitis B Surface Ag: NEGATIVE
Immature Grans (Abs): 0 10*3/uL (ref 0.0–0.1)
Immature Granulocytes: 0 %
LYMPHS ABS: 1.3 10*3/uL (ref 0.7–3.1)
Lymphs: 19 %
MCH: 27.4 pg (ref 26.6–33.0)
MCHC: 33.1 g/dL (ref 31.5–35.7)
MCV: 83 fL (ref 79–97)
MONOS ABS: 0.4 10*3/uL (ref 0.1–0.9)
Monocytes: 6 %
NEUTROS ABS: 5.1 10*3/uL (ref 1.4–7.0)
Neutrophils: 74 %
Platelets: 258 10*3/uL (ref 150–450)
RBC: 3.83 x10E6/uL (ref 3.77–5.28)
RDW: 13.9 % (ref 12.3–15.4)
RPR Ser Ql: NONREACTIVE
Rh Factor: NEGATIVE
Rubella Antibodies, IGG: 5.88 index (ref >0.99)
WBC: 6.8 10*3/uL (ref 3.4–10.8)

## 2018-02-05 LAB — GLUCOSE TOLERANCE, 2 HOURS W/ 1HR
GLUCOSE, FASTING: 81 mg/dL (ref 65–91)
Glucose, 1 hour: 92 mg/dL (ref 65–179)
Glucose, 2 hour: 88 mg/dL (ref 65–152)

## 2018-02-06 ENCOUNTER — Encounter: Payer: Self-pay | Admitting: Advanced Practice Midwife

## 2018-02-06 DIAGNOSIS — R87610 Atypical squamous cells of undetermined significance on cytologic smear of cervix (ASC-US): Secondary | ICD-10-CM | POA: Insufficient documentation

## 2018-02-06 DIAGNOSIS — R87810 Cervical high risk human papillomavirus (HPV) DNA test positive: Secondary | ICD-10-CM

## 2018-02-13 ENCOUNTER — Encounter: Payer: Self-pay | Admitting: Advanced Practice Midwife

## 2018-02-13 NOTE — Progress Notes (Signed)
CfDNA not drawn w/PN2.  Pt decided now not to do it. Order cancelled

## 2018-02-21 ENCOUNTER — Encounter: Payer: Self-pay | Admitting: Women's Health

## 2018-02-27 ENCOUNTER — Ambulatory Visit (INDEPENDENT_AMBULATORY_CARE_PROVIDER_SITE_OTHER): Payer: Medicaid Other | Admitting: Women's Health

## 2018-02-27 ENCOUNTER — Encounter: Payer: Self-pay | Admitting: Women's Health

## 2018-02-27 VITALS — BP 126/81 | HR 78 | Wt 257.3 lb

## 2018-02-27 DIAGNOSIS — Z3A31 31 weeks gestation of pregnancy: Secondary | ICD-10-CM

## 2018-02-27 DIAGNOSIS — Z3483 Encounter for supervision of other normal pregnancy, third trimester: Secondary | ICD-10-CM

## 2018-02-27 DIAGNOSIS — Z6791 Unspecified blood type, Rh negative: Secondary | ICD-10-CM

## 2018-02-27 DIAGNOSIS — Z23 Encounter for immunization: Secondary | ICD-10-CM | POA: Diagnosis not present

## 2018-02-27 DIAGNOSIS — O26899 Other specified pregnancy related conditions, unspecified trimester: Secondary | ICD-10-CM

## 2018-02-27 DIAGNOSIS — O360130 Maternal care for anti-D [Rh] antibodies, third trimester, not applicable or unspecified: Secondary | ICD-10-CM | POA: Diagnosis not present

## 2018-02-27 DIAGNOSIS — Z1389 Encounter for screening for other disorder: Secondary | ICD-10-CM

## 2018-02-27 DIAGNOSIS — Z331 Pregnant state, incidental: Secondary | ICD-10-CM

## 2018-02-27 LAB — POCT URINALYSIS DIPSTICK OB
Blood, UA: NEGATIVE
Glucose, UA: NEGATIVE
LEUKOCYTES UA: NEGATIVE
Nitrite, UA: NEGATIVE
POC,PROTEIN,UA: NEGATIVE

## 2018-02-27 MED ORDER — RHO D IMMUNE GLOBULIN 1500 UNIT/2ML IJ SOSY
300.0000 ug | PREFILLED_SYRINGE | Freq: Once | INTRAMUSCULAR | Status: AC
  Administered 2018-02-27: 300 ug via INTRAMUSCULAR

## 2018-02-27 NOTE — Patient Instructions (Signed)
Carolyn Coffey, I greatly value your feedback.  If you receive a survey following your visit with [place] today, we appreciate you taking the time to fill it out.  Thanks, [name], CNM, WHNP-BC   Call the office [phone]) or go to [facility] if:  You begin to have strong, frequent contractions  Your water breaks.  Sometimes it is a big gush of fluid, sometimes it is just a trickle that keeps getting your panties wet or running down your legs  You have vaginal bleeding.  It is normal to have a small amount of spotting if your cervix was checked.   You don't feel your baby moving like normal.  If you don't, get you something to eat and drink and lay down and focus on feeling your baby move.  You should feel at least 10 movements in 2 hours.  If you don't, you should call the office or go to [facility].    Tdap Vaccine  It is recommended that you get the Tdap vaccine during the third trimester of EACH pregnancy to help protect your baby from getting pertussis (whooping cough)  27-36 weeks is the BEST time to do this so that you can pass the protection on to your baby. During pregnancy is better than after pregnancy, but if you are unable to get it during pregnancy it will be offered at the hospital.   You can get this vaccine with [place], at the health department, your family doctor, or some local pharmacies  Everyone who will be around your baby should also be up-to-date on their vaccines before the baby comes. Adults (who are not pregnant) only need 1 dose of Tdap during adulthood.   Third Trimester of Pregnancy The third trimester is from week 29 through week 42, months 7 through 9. The third trimester is a time when the fetus is growing rapidly. At the end of the ninth month, the fetus is about 20 inches in length and weighs 6-10 pounds.  BODY CHANGES Your body goes through many changes during pregnancy. The changes vary from woman to woman.   Your weight will continue to  increase. You can expect to gain 25-35 pounds (11-16 kg) by the end of the pregnancy.  You may begin to get stretch marks on your hips, abdomen, and breasts.  You may urinate more often because the fetus is moving lower into your pelvis and pressing on your bladder.  You may develop or continue to have heartburn as a result of your pregnancy.  You may develop constipation because certain hormones are causing the muscles that push waste through your intestines to slow down.  You may develop hemorrhoids or swollen, bulging veins (varicose veins).  You may have pelvic pain because of the weight gain and pregnancy hormones relaxing your joints between the bones in your pelvis. Backaches may result from overexertion of the muscles supporting your posture.  You may have changes in your hair. These can include thickening of your hair, rapid growth, and changes in texture. Some women also have hair loss during or after pregnancy, or hair that feels dry or thin. Your hair will most likely return to normal after your baby is born.  Your breasts will continue to grow and be tender. A yellow discharge may leak from your breasts called colostrum.  Your belly button may stick out.  You may feel short of breath because of your expanding uterus.  You may notice the fetus "dropping," or moving lower in  your abdomen.  You may have a bloody mucus discharge. This usually occurs a few days to a week before labor begins.  Your cervix becomes thin and soft (effaced) near your due date. WHAT TO EXPECT AT YOUR PRENATAL EXAMS  You will have prenatal exams every 2 weeks until week 36. Then, you will have weekly prenatal exams. During a routine prenatal visit:  You will be weighed to make sure you and the fetus are growing normally.  Your blood pressure is taken.  Your abdomen will be measured to track your baby's growth.  The fetal heartbeat will be listened to.  Any test results from the previous visit  will be discussed.  You may have a cervical check near your due date to see if you have effaced. At around 36 weeks, your caregiver will check your cervix. At the same time, your caregiver will also perform a test on the secretions of the vaginal tissue. This test is to determine if a type of bacteria, Group B streptococcus, is present. Your caregiver will explain this further. Your caregiver may ask you:  What your birth plan is.  How you are feeling.  If you are feeling the baby move.  If you have had any abnormal symptoms, such as leaking fluid, bleeding, severe headaches, or abdominal cramping.  If you have any questions. Other tests or screenings that may be performed during your third trimester include:  Blood tests that check for low iron levels (anemia).  Fetal testing to check the health, activity level, and growth of the fetus. Testing is done if you have certain medical conditions or if there are problems during the pregnancy. FALSE LABOR You may feel small, irregular contractions that eventually go away. These are called Braxton Hicks contractions, or false labor. Contractions may last for hours, days, or even weeks before true labor sets in. If contractions come at regular intervals, intensify, or become painful, it is best to be seen by your caregiver.  SIGNS OF LABOR   Menstrual-like cramps.  Contractions that are 5 minutes apart or less.  Contractions that start on the top of the uterus and spread down to the lower abdomen and back.  A sense of increased pelvic pressure or back pain.  A watery or bloody mucus discharge that comes from the vagina. If you have any of these signs before the 37th week of pregnancy, call your caregiver right away. You need to go to the hospital to get checked immediately. HOME CARE INSTRUCTIONS   Avoid all smoking, herbs, alcohol, and unprescribed drugs. These chemicals affect the formation and growth of the baby.  Follow your  caregiver's instructions regarding medicine use. There are medicines that are either safe or unsafe to take during pregnancy.  Exercise only as directed by your caregiver. Experiencing uterine cramps is a good sign to stop exercising.  Continue to eat regular, healthy meals.  Wear a good support bra for breast tenderness.  Do not use hot tubs, steam rooms, or saunas.  Wear your seat belt at all times when driving.  Avoid raw meat, uncooked cheese, cat litter boxes, and soil used by cats. These carry germs that can cause birth defects in the baby.  Take your prenatal vitamins.  Try taking a stool softener (if your caregiver approves) if you develop constipation. Eat more high-fiber foods, such as fresh vegetables or fruit and whole grains. Drink plenty of fluids to keep your urine clear or pale yellow.  Take warm sitz baths to  soothe any pain or discomfort caused by hemorrhoids. Use hemorrhoid cream if your caregiver approves.  If you develop varicose veins, wear support hose. Elevate your feet for 15 minutes, 3-4 times a day. Limit salt in your diet.  Avoid heavy lifting, wear low heal shoes, and practice good posture.  Rest a lot with your legs elevated if you have leg cramps or low back pain.  Visit your dentist if you have not gone during your pregnancy. Use a soft toothbrush to brush your teeth and be gentle when you floss.  A sexual relationship may be continued unless your caregiver directs you otherwise.  Do not travel far distances unless it is absolutely necessary and only with the approval of your caregiver.  Take prenatal classes to understand, practice, and ask questions about the labor and delivery.  Make a trial run to the hospital.  Pack your hospital bag.  Prepare the baby's nursery.  Continue to go to all your prenatal visits as directed by your caregiver. SEEK MEDICAL CARE IF:  You are unsure if you are in labor or if your water has broken.  You have  dizziness.  You have mild pelvic cramps, pelvic pressure, or nagging pain in your abdominal area.  You have persistent nausea, vomiting, or diarrhea.  You have a bad smelling vaginal discharge.  You have pain with urination. SEEK IMMEDIATE MEDICAL CARE IF:   You have a fever.  You are leaking fluid from your vagina.  You have spotting or bleeding from your vagina.  You have severe abdominal cramping or pain.  You have rapid weight loss or gain.  You have shortness of breath with chest pain.  You notice sudden or extreme swelling of your face, hands, ankles, feet, or legs.  You have not felt your baby move in over an hour.  You have severe headaches that do not go away with medicine.  You have vision changes. Document Released: 06/06/2001 Document Revised: 06/17/2013 Document Reviewed: 08/13/2012 [facility] Patient Information 2015 [place], [place]. This information is not intended to replace advice given to you by your health care provider. Make sure you discuss any questions you have with your health care provider.

## 2018-02-27 NOTE — Progress Notes (Signed)
   LOW-RISK PREGNANCY VISIT Patient name: Carolyn Coffey MRN [id number]  Date of birth: [birth year] Chief Complaint:   Routine Prenatal Visit  History of Present Illness:   Carolyn Coffey is a 27 y.o. [id number] female at [redacted]w[redacted]d with an Estimated Date of Delivery: 04/25/18 being seen today for ongoing management of a low-risk pregnancy.  Today she reports no complaints. Contractions: Irregular.  .  Movement: Present. denies leaking of fluid. Review of Systems:   Pertinent items are noted in HPI Denies abnormal vaginal discharge w/ itching/odor/irritation, headaches, visual changes, shortness of breath, chest pain, abdominal pain, severe nausea/vomiting, or problems with urination or bowel movements unless otherwise stated above. Pertinent History Reviewed:  Reviewed past medical,surgical, social, obstetrical and family history.  Reviewed problem list, medications and allergies. Physical Assessment:   Vitals:   02/27/18 1522  BP: 126/81  Pulse: 78  Weight: 257 lb 4.8 oz (116.7 kg)  Body mass index is 36.92 kg/m.        Physical Examination:   General appearance: Well appearing, and in no distress  Mental status: Alert, oriented to person, place, and time  Skin: Warm & dry  Cardiovascular: Normal heart rate noted  Respiratory: Normal respiratory effort, no distress  Abdomen: Soft, gravid, nontender  Pelvic: Cervical exam deferred         Extremities: Edema: None  Fetal Status: Fetal Heart Rate (bpm): 150 Fundal Height: 31 cm Movement: Present    Results for orders placed or performed in visit on 02/27/18 (from the past 24 hour(s))  POC Urinalysis Dipstick OB   Collection Time: 02/27/18  3:29 PM  Result Value Ref Range   Color, UA     Clarity, UA     Glucose, UA Negative Negative   Bilirubin, UA     Ketones, UA moderate    Spec Grav, UA     Blood, UA neg    pH, UA     POC Protein UA Negative Negative, Trace   Urobilinogen, UA     Nitrite, UA neg    Leukocytes, UA  Negative Negative   Appearance     Odor      Assessment & Plan:  1) Low-risk pregnancy [id number] at [redacted]w[redacted]d with an Estimated Date of Delivery: 04/25/18   2) Rh neg, Rhogam to day   Meds:  Meds ordered this encounter  Medications  . rho (d) immune globulin (RHIG/RHOPHYLAC) injection 300 mcg   Labs/procedures today: rhogam and flu shot  Plan:  Continue routine obstetrical care   Reviewed: Preterm labor symptoms and general obstetric precautions including but not limited to vaginal bleeding, contractions, leaking of fluid and fetal movement were reviewed in detail with the patient.  All questions were answered  Follow-up: Return in about 2 weeks (around 03/13/2018) for LROB.  Orders Placed This Encounter  Procedures  . Flu Vaccine QUAD 36+ mos IM  . POC Urinalysis Dipstick OB   Cheral Marker CNM, [facility] 02/27/2018 4:51 PM

## 2018-03-12 ENCOUNTER — Telehealth: Payer: Self-pay | Admitting: *Deleted

## 2018-03-12 NOTE — Telephone Encounter (Signed)
Patient states she went to the bathroom this morning and noticed blood was "dripping out of her rectum". States she does have hemorrhoids but has never experienced this before.  She also says the baby is not moving like normal.  [facility] as our office is closing for a meeting.  Verbalized understanding.

## 2018-03-13 ENCOUNTER — Encounter: Payer: Medicaid Other | Admitting: Obstetrics and Gynecology

## 2018-03-21 ENCOUNTER — Encounter: Payer: Self-pay | Admitting: Obstetrics & Gynecology

## 2018-03-21 ENCOUNTER — Other Ambulatory Visit: Payer: Self-pay

## 2018-03-21 ENCOUNTER — Ambulatory Visit (INDEPENDENT_AMBULATORY_CARE_PROVIDER_SITE_OTHER): Payer: Medicaid Other | Admitting: Obstetrics & Gynecology

## 2018-03-21 VITALS — BP 116/78 | HR 84 | Wt 261.0 lb

## 2018-03-21 DIAGNOSIS — Z1389 Encounter for screening for other disorder: Secondary | ICD-10-CM

## 2018-03-21 DIAGNOSIS — Z3483 Encounter for supervision of other normal pregnancy, third trimester: Secondary | ICD-10-CM

## 2018-03-21 DIAGNOSIS — Z3A35 35 weeks gestation of pregnancy: Secondary | ICD-10-CM

## 2018-03-21 DIAGNOSIS — Z331 Pregnant state, incidental: Secondary | ICD-10-CM

## 2018-03-21 LAB — POCT URINALYSIS DIPSTICK OB
Glucose, UA: NEGATIVE
LEUKOCYTES UA: NEGATIVE
Nitrite, UA: NEGATIVE
PROTEIN: NEGATIVE
RBC UA: NEGATIVE

## 2018-03-21 MED ORDER — ACYCLOVIR 400 MG PO TABS: 400.0000 mg | ORAL_TABLET | Freq: Three times a day (TID) | ORAL | 2 refills | Status: DC

## 2018-03-21 NOTE — Addendum Note (Signed)
Addended by: Lazaro Arms on: 03/21/2018 04:13 PM   Modules accepted: Orders

## 2018-03-21 NOTE — Progress Notes (Addendum)
   LOW-RISK PREGNANCY VISIT Patient name: Carolyn Coffey MRN [id number]  Date of birth: Aug 31, 1990 Chief Complaint:   Routine Prenatal Visit  History of Present Illness:   Carolyn Coffey is a 27 y.o. [id number] female at [redacted]w[redacted]d with an Estimated Date of Delivery: 04/25/18 being seen today for ongoing management of a low-risk pregnancy.  Today she reports no complaints. Contractions: Irregular. Vag. Bleeding: None.  Movement: Present. denies leaking of fluid. Review of Systems:   Pertinent items are noted in HPI Denies abnormal vaginal discharge w/ itching/odor/irritation, headaches, visual changes, shortness of breath, chest pain, abdominal pain, severe nausea/vomiting, or problems with urination or bowel movements unless otherwise stated above. Pertinent History Reviewed:  Reviewed past medical,surgical, social, obstetrical and family history.  Reviewed problem list, medications and allergies. Physical Assessment:   Vitals:   03/21/18 1555  BP: 116/78  Pulse: 84  Weight: 261 lb (118.4 kg)  Body mass index is 37.45 kg/m.        Physical Examination:   General appearance: Well appearing, and in no distress  Mental status: Alert, oriented to person, place, and time  Skin: Warm & dry  Cardiovascular: Normal heart rate noted  Respiratory: Normal respiratory effort, no distress  Abdomen: Soft, gravid, nontender  Pelvic: Cervical exam deferred         Extremities: Edema: None  Fetal Status: Fetal Heart Rate (bpm): 154 Fundal Height: 20 cm Movement: Present    Results for orders placed or performed in visit on 03/21/18 (from the past 24 hour(s))  POC Urinalysis Dipstick OB   Collection Time: 03/21/18  3:56 PM  Result Value Ref Range   Color, UA     Clarity, UA     Glucose, UA Negative Negative   Bilirubin, UA     Ketones, UA small    Spec Grav, UA     Blood, UA neg    pH, UA     POC Protein UA Negative Negative, Trace   Urobilinogen, UA     Nitrite, UA neg    Leukocytes,  UA Negative Negative   Appearance     Odor      Assessment & Plan:  1) Low-risk pregnancy [id number] at [redacted]w[redacted]d with an Estimated Date of Delivery: 04/25/18   FH U+20   Meds:  Meds ordered this encounter  Medications  . acyclovir (ZOVIRAX) 400 MG tablet    Sig: Take 1 tablet (400 mg total) by mouth 3 (three) times daily.    Dispense:  90 tablet    Refill:  2   Labs/procedures today:   Plan:  Continue routine obstetrical care   Reviewed: Preterm labor symptoms and general obstetric precautions including but not limited to vaginal bleeding, contractions, leaking of fluid and fetal movement were reviewed in detail with the patient.  All questions were answered  Follow-up: Return in about 2 weeks (around 04/04/2018) for LROB.  Orders Placed This Encounter  Procedures  . POC Urinalysis Dipstick OB   Lazaro Arms  03/21/2018 4:13 PM

## 2018-04-04 ENCOUNTER — Ambulatory Visit (INDEPENDENT_AMBULATORY_CARE_PROVIDER_SITE_OTHER): Payer: Medicaid Other | Admitting: Advanced Practice Midwife

## 2018-04-04 ENCOUNTER — Encounter: Payer: Self-pay | Admitting: Advanced Practice Midwife

## 2018-04-04 VITALS — BP 134/89 | HR 83 | Wt 262.0 lb

## 2018-04-04 DIAGNOSIS — O9989 Other specified diseases and conditions complicating pregnancy, childbirth and the puerperium: Secondary | ICD-10-CM

## 2018-04-04 DIAGNOSIS — Z1389 Encounter for screening for other disorder: Secondary | ICD-10-CM

## 2018-04-04 DIAGNOSIS — Z331 Pregnant state, incidental: Secondary | ICD-10-CM

## 2018-04-04 DIAGNOSIS — Z3A37 37 weeks gestation of pregnancy: Secondary | ICD-10-CM

## 2018-04-04 DIAGNOSIS — Z3483 Encounter for supervision of other normal pregnancy, third trimester: Secondary | ICD-10-CM

## 2018-04-04 DIAGNOSIS — A6000 Herpesviral infection of urogenital system, unspecified: Secondary | ICD-10-CM

## 2018-04-04 LAB — POCT URINALYSIS DIPSTICK OB
GLUCOSE, UA: NEGATIVE
KETONES UA: NEGATIVE
Leukocytes, UA: NEGATIVE
Nitrite, UA: NEGATIVE
RBC UA: NEGATIVE

## 2018-04-04 NOTE — Patient Instructions (Addendum)
Go to [facility] Admissions Unit at 8 am on Saturday, and tell them you are there for a "version".  Don't eat anything after midnight on Friday, you may have plenty of liquids.    Breech Birth What is a breech birth? A breech birth is when a baby is born with the buttocks or the feet first. Most babies are in a head down (vertex) position when they are born. There are three types of breech babies:  When the baby's buttocks are showing first in the birth canal (vagina) with the legs straight up and the feet at the baby's head (frank breech).  When the baby's buttocks shows first with the legs bent at the knees and the feet down near the buttocks (complete breech).  When one or both of the baby's feet are down below the buttocks (footling breech).  What are the risks of a breech birth? Having a breech birth increases the risk to your baby. A breech birth may cause the following:  Umbilical cord prolapse. This is when the umbilical cord is in front of the baby before or during labor. This can cause the cord to become pinched or compressed. This can reduce the flow of blood and oxygen to the baby.  The baby getting stuck in the birth canal, which can cause injury or, rarely, death.  Injury to the nerves in the shoulder, arm, and hand (brachial plexus injury) when delivered.  Your baby being born too early (prematurely).  An increased need for a cesarean delivery.  What increases the risk of having a breech baby? It is not known what causes your baby to be breech. However, risk factors that may increase your chances of having a breech baby include the following:  The mother having had several babies already.  The mother having twins or more.  The mother having a baby with certain congenital disabilities.  The mother going into labor early.  The mother having problems with her uterus, such as a tumor.  The mother having placenta problems (placenta previa) or too much or not enough  fluid surrounding the baby (amniotic fluid).  How do I know if my baby is breech? There are no symptoms for you to know that your baby is breech. When you are close to your due date, your health care provider can tell if your baby is breech by:  An abdominal or vaginal (pelvic) exam.  An ultrasound.  Your health care provider may also be able to tell that your baby is breech if your baby's heartbeat is heard above your belly button. What can be done if my baby is breech?  Your health care provider may try to turn the baby in your uterus. This is a procedure called external cephalic version (ECV). This is done by your health care provider. He or she will place both hands on your abdomen and gently and slowly turn the baby around. It is important to know that ECV can increase your chances of suddenly going into labor. If an ECV is done, it is done toward the end of a healthy pregnancy. The baby may remain in this position or he or she may turn back to the breech position. You and your health care provider will discuss if an ECV is recommended for you and your baby. How will I delivery my baby if my baby is breech? You and your health care provider will discuss the best way to deliver your baby. If your baby is breech, it is  less likely that a vaginal delivery will be recommended due to the risks. Some breech babies may be delivered safely without a cesarean, while in other cases health care providers will recommend a cesarean delivery. This information is not intended to replace advice given to you by your health care provider. Make sure you discuss any questions you have with your health care provider. Document Released: 08/03/2006 Document Revised: 05/29/2016 Document Reviewed: 04/16/2014 Elsevier Interactive Patient Education  2017 [employer]. .

## 2018-04-04 NOTE — Progress Notes (Signed)
LOW-RISK PREGNANCY VISIT Patient name: Carolyn Coffey MRN [id number]  Date of birth: June 15, 1991 Chief Complaint:   Routine Prenatal Visit (gbs-chl-gc)  History of Present Illness:   Carolyn Coffey is a 27 y.o. [id number] female at [redacted]w[redacted]d with an Estimated Date of Delivery: 04/25/18 being seen today for ongoing management of a low-risk pregnancy.  Today she reports no complaints. Contractions: Irregular.  .  Movement: Present. denies leaking of fluid. Review of Systems:   Pertinent items are noted in HPI Denies abnormal vaginal discharge w/ itching/odor/irritation, headaches, visual changes, shortness of breath, chest pain, abdominal pain, severe nausea/vomiting, or problems with urination or bowel movements unless otherwise stated above.  Pertinent History Reviewed:  Medical & Surgical Hx:   Past Medical History:  Diagnosis Date  . Genital herpes   . Mental disorder    anxiety   Past Surgical History:  Procedure Laterality Date  . NO PAST SURGERIES     Family History  Problem Relation Age of Onset  . Cancer Paternal Grandfather        skin  . Hypertension Paternal Grandmother   . Hypertension Maternal Grandmother   . Diabetes Maternal Grandfather   . Colon cancer Father   . Turner syndrome Daughter     Current Outpatient Medications:  .  acyclovir (ZOVIRAX) 400 MG tablet, Take 1 tablet (400 mg total) by mouth 3 (three) times daily., Disp: 90 tablet, Rfl: 2 .  Prenatal Vit-Fe Fumarate-FA (PRENATAL VITAMIN PO), Take by mouth., Disp: , Rfl:  Social History: Reviewed -  reports that she has quit smoking. She has quit using smokeless tobacco.  Physical Assessment:   Vitals:   04/04/18 1538  BP: 134/89  Pulse: 83  Weight: 262 lb (118.8 kg)  Body mass index is 37.59 kg/m.        Physical Examination:   General appearance: Well appearing, and in no distress  Mental status: Alert, oriented to person, place, and time  Skin: Warm & dry  Cardiovascular: Normal heart  rate noted  Respiratory: Normal respiratory effort, no distress  Abdomen: Soft, gravid, nontender  Pelvic: Cervical exam performed         Extremities: Edema: None  Fetal Status:     Movement: Present    Results for orders placed or performed in visit on 04/04/18 (from the past 24 hour(s))  POC Urinalysis Dipstick OB   Collection Time: 04/04/18  3:43 PM  Result Value Ref Range   Color, UA     Clarity, UA     Glucose, UA Negative Negative   Bilirubin, UA     Ketones, UA neg    Spec Grav, UA     Blood, UA neg    pH, UA     POC Protein UA Trace Negative, Trace   Urobilinogen, UA     Nitrite, UA neg    Leukocytes, UA Negative Negative   Appearance     Odor      Assessment & Plan:  1) Low-risk pregnancy [id number] at [redacted]w[redacted]d with an Estimated Date of Delivery: 04/25/18   2) , BREECH:  ECV scheduled for Sat am 0800  w/[place] Risks/benefits discussed. 3) borderline BP; will continue to watch   Labs/procedures/US today: GBS/CHL/GC  Plan:  Continue routine obstetrical care    Follow-up: Return in about 1 week (around 04/11/2018) for LROB.  Orders Placed This Encounter  Procedures  . Strep Gp B NAA  . GC/Chlamydia Probe Amp  . POC Urinalysis Dipstick OB   Jacklyn Shell  CNM 04/04/2018 4:07 PM

## 2018-04-06 ENCOUNTER — Encounter (HOSPITAL_COMMUNITY): Payer: Self-pay

## 2018-04-06 ENCOUNTER — Observation Stay (HOSPITAL_COMMUNITY)
Admission: RE | Admit: 2018-04-06 | Discharge: 2018-04-06 | Disposition: A | Discharge status: Home / Self Care | Payer: Medicaid Other | Source: Ambulatory Visit | Attending: Family Medicine | Admitting: Family Medicine

## 2018-04-06 DIAGNOSIS — Z3483 Encounter for supervision of other normal pregnancy, third trimester: Secondary | ICD-10-CM

## 2018-04-06 DIAGNOSIS — F419 Anxiety disorder, unspecified: Secondary | ICD-10-CM | POA: Insufficient documentation

## 2018-04-06 DIAGNOSIS — O99343 Other mental disorders complicating pregnancy, third trimester: Secondary | ICD-10-CM | POA: Insufficient documentation

## 2018-04-06 DIAGNOSIS — O321XX0 Maternal care for breech presentation, not applicable or unspecified: Principal | ICD-10-CM | POA: Insufficient documentation

## 2018-04-06 DIAGNOSIS — Z3A37 37 weeks gestation of pregnancy: Secondary | ICD-10-CM | POA: Insufficient documentation

## 2018-04-06 DIAGNOSIS — R87610 Atypical squamous cells of undetermined significance on cytologic smear of cervix (ASC-US): Secondary | ICD-10-CM

## 2018-04-06 DIAGNOSIS — Z87891 Personal history of nicotine dependence: Secondary | ICD-10-CM | POA: Insufficient documentation

## 2018-04-06 DIAGNOSIS — R87810 Cervical high risk human papillomavirus (HPV) DNA test positive: Secondary | ICD-10-CM

## 2018-04-06 LAB — GC/CHLAMYDIA PROBE AMP
Chlamydia trachomatis, NAA: NEGATIVE
Neisseria gonorrhoeae by PCR: NEGATIVE

## 2018-04-06 LAB — STREP GP B NAA: Strep Gp B NAA: NEGATIVE

## 2018-04-06 MED ORDER — TERBUTALINE SULFATE 1 MG/ML IJ SOLN
INTRAMUSCULAR | Status: AC
  Filled 2018-04-06: qty 1

## 2018-04-06 MED ORDER — LACTATED RINGERS IV SOLN: INTRAVENOUS | Status: DC

## 2018-04-06 MED ORDER — TERBUTALINE SULFATE 1 MG/ML IJ SOLN
0.2500 mg | Freq: Once | INTRAMUSCULAR | Status: AC
  Administered 2018-04-06: 0.25 mg via SUBCUTANEOUS
  Filled 2018-04-06: qty 1

## 2018-04-06 NOTE — H&P (Signed)
Carolyn Coffey is an 27 y.o. [phone] [redacted]w[redacted]d female.   Chief Complaint: breech presentation HPI: Here for attempt at ECV  Past Medical History:  Diagnosis Date  . Genital herpes   . Mental disorder    anxiety    Past Surgical History:  Procedure Laterality Date  . NO PAST SURGERIES      Family History  Problem Relation Age of Onset  . Cancer Paternal Grandfather        skin  . Hypertension Paternal Grandmother   . Hypertension Maternal Grandmother   . Diabetes Maternal Grandfather   . Colon cancer Father   . Turner syndrome Daughter    Social History:  reports that she has quit smoking. She has quit using smokeless tobacco. She reports that she does not drink alcohol or use drugs.   No Known Allergies  Medications Prior to Admission  Medication Sig Dispense Refill  . acyclovir (ZOVIRAX) 400 MG tablet Take 1 tablet (400 mg total) by mouth 3 (three) times daily. 90 tablet 2  . Prenatal Vit-Fe Fumarate-FA (PRENATAL VITAMIN PO) Take by mouth.       A comprehensive review of systems was negative.  Blood pressure 104/68, pulse 88, height 5\' 10"  (1.778 m), weight 117.4 kg, last menstrual period 09/18/2017, unknown if currently breastfeeding. BP 104/68   Pulse 88   Ht 5\' 10"  (1.778 m)   Wt 117.4 kg   LMP 09/18/2017 (Approximate)   BMI 37.13 kg/m  General appearance: alert, cooperative, appears stated age and moderately obese Head: Normocephalic, without obvious abnormality, atraumatic Neck: supple, symmetrical, trachea midline Lungs: normal effort Heart: regular rate and rhythm Abdomen: gravid, non-tender Extremities: Homans sign is negative, no sign of DVT Skin: Skin color, texture, turgor normal. No rashes or lesions Neurologic: Grossly normal   Lab Results  Component Value Date   WBC 6.8 02/04/2018   HGB 10.5 (L) 02/04/2018   HCT 31.7 (L) 02/04/2018   MCV 83 02/04/2018   PLT 258 02/04/2018         ABO, Rh: A/Negative/-- (08/12 0904)  Antibody:  Negative (08/12 0904)  Rubella: 5.88 (08/12 0904)  RPR: Non Reactive (08/12 0904)  HBsAg: Negative (08/12 0904)  HIV: Non Reactive (08/12 0904)  GBS: Negative (10/10 1640)     Assessment/Plan Active Problems:   Breech presentation, antepartum  For ECV  Carolyn Coffey 04/06/2018, 9:00 AM

## 2018-04-11 ENCOUNTER — Encounter: Payer: Medicaid Other | Admitting: Women's Health

## 2018-04-15 ENCOUNTER — Encounter: Payer: Self-pay | Admitting: Women's Health

## 2018-04-15 ENCOUNTER — Ambulatory Visit (INDEPENDENT_AMBULATORY_CARE_PROVIDER_SITE_OTHER): Payer: Medicaid Other | Admitting: Women's Health

## 2018-04-15 VITALS — BP 121/76 | HR 84 | Wt 260.5 lb

## 2018-04-15 DIAGNOSIS — Z3483 Encounter for supervision of other normal pregnancy, third trimester: Secondary | ICD-10-CM

## 2018-04-15 DIAGNOSIS — O321XX0 Maternal care for breech presentation, not applicable or unspecified: Secondary | ICD-10-CM

## 2018-04-15 DIAGNOSIS — Z3A38 38 weeks gestation of pregnancy: Secondary | ICD-10-CM

## 2018-04-15 DIAGNOSIS — Z1389 Encounter for screening for other disorder: Secondary | ICD-10-CM

## 2018-04-15 DIAGNOSIS — Z331 Pregnant state, incidental: Secondary | ICD-10-CM

## 2018-04-15 LAB — POCT URINALYSIS DIPSTICK OB
GLUCOSE, UA: NEGATIVE
Ketones, UA: NEGATIVE
Nitrite, UA: NEGATIVE
POC,PROTEIN,UA: NEGATIVE
RBC UA: NEGATIVE

## 2018-04-15 NOTE — Progress Notes (Signed)
After informed verbal consent, Terbutaline 0.25 mg SQ given, ECV was attempted under Ultrasound guidance.  Forward roll attempted x 2 with success.   FHR was reactive before and after the procedure.   Pt. Tolerated the procedure well.

## 2018-04-15 NOTE — Progress Notes (Signed)
   LOW-RISK PREGNANCY VISIT Patient name: Carolyn Coffey MRN [id number]  Date of birth: 06/27/1990 Chief Complaint:   Routine Prenatal Visit  History of Present Illness:   Carolyn Coffey is a 27 y.o. [id number] female at [redacted]w[redacted]d with an Estimated Date of Delivery: 04/25/18 being seen today for ongoing management of a low-risk pregnancy.  Today she reports no complaints. Interested in membrane sweeping/elective IOL d/t husband working out of town, he missed last birth. Contractions: Irregular. Vag. Bleeding: None.  Movement: Present. denies leaking of fluid. Review of Systems:   Pertinent items are noted in HPI Denies abnormal vaginal discharge w/ itching/odor/irritation, headaches, visual changes, shortness of breath, chest pain, abdominal pain, severe nausea/vomiting, or problems with urination or bowel movements unless otherwise stated above. Pertinent History Reviewed:  Reviewed past medical,surgical, social, obstetrical and family history.  Reviewed problem list, medications and allergies. Physical Assessment:   Vitals:   04/15/18 1402  BP: 121/76  Pulse: 84  Weight: 260 lb 8 oz (118.2 kg)  Body mass index is 37.38 kg/m.        Physical Examination:   General appearance: Well appearing, and in no distress  Mental status: Alert, oriented to person, place, and time  Skin: Warm & dry  Cardiovascular: Normal heart rate noted  Respiratory: Normal respiratory effort, no distress  Abdomen: Soft, gravid, nontender, bruises on abd from ECV  Pelvic: Cervical exam performed  Dilation: 3 Effacement (%): 50 Station: -2  Extremities: Edema: None  Fetal Status: Fetal Heart Rate (bpm): 136 Fundal Height: 38 cm Movement: Present Presentation: Vertex  Results for orders placed or performed in visit on 04/15/18 (from the past 24 hour(s))  POC Urinalysis Dipstick OB   Collection Time: 04/15/18  2:02 PM  Result Value Ref Range   Color, UA     Clarity, UA     Glucose, UA Negative Negative   Bilirubin, UA     Ketones, UA neg    Spec Grav, UA     Blood, UA neg    pH, UA     POC Protein UA Negative Negative, Trace   Urobilinogen, UA     Nitrite, UA neg    Leukocytes, UA Moderate (2+) (A) Negative   Appearance     Odor      Assessment & Plan:  1) Low-risk pregnancy [id number] at [redacted]w[redacted]d with an Estimated Date of Delivery: 04/25/18   2) S/P successful ECV, still vtx   Meds: No orders of the defined types were placed in this encounter.  Labs/procedures today: sve  Plan:  Continue routine obstetrical care   Reviewed: Term labor symptoms and general obstetric precautions including but not limited to vaginal bleeding, contractions, leaking of fluid and fetal movement were reviewed in detail with the patient.  All questions were answered  Follow-up: Return for Friday for LROB. and membrane sweeping (husband will be home this weekend)  Orders Placed This Encounter  Procedures  . POC Urinalysis Dipstick OB   Cheral Marker CNM, [facility] 04/15/2018 2:30 PM

## 2018-04-15 NOTE — Patient Instructions (Signed)
Carolyn Coffey, I greatly value your feedback.  If you receive a survey following your visit with [place] today, we appreciate you taking the time to fill it out.  Thanks, Carolyn Coffey, CNM, WHNP-BC   Call the office [phone]) or go to [facility] if:  You begin to have strong, frequent contractions  Your water breaks.  Sometimes it is a big gush of fluid, sometimes it is just a trickle that keeps getting your panties wet or running down your legs  You have vaginal bleeding.  It is normal to have a small amount of spotting if your cervix was checked.   You don't feel your baby moving like normal.  If you don't, get you something to eat and drink and lay down and focus on feeling your baby move.  You should feel at least 10 movements in 2 hours.  If you don't, you should call the office or go to [facility].     [facility] Contractions Contractions of the uterus can occur throughout pregnancy, but they are not always a sign that you are in labor. You may have practice contractions called Braxton Hicks contractions. These false labor contractions are sometimes confused with true labor. What are Carolyn Coffey contractions? Braxton Hicks contractions are tightening movements that occur in the muscles of the uterus before labor. Unlike true labor contractions, these contractions do not result in opening (dilation) and thinning of the cervix. Toward the end of pregnancy (32-34 weeks), Braxton Hicks contractions can happen more often and may become stronger. These contractions are sometimes difficult to tell apart from true labor because they can be very uncomfortable. You should not feel embarrassed if you go to the hospital with false labor. Sometimes, the only way to tell if you are in true labor is for your health care provider to look for changes in the cervix. The health care provider will do a physical exam and may monitor your contractions. If you are not in true labor, the exam should  show that your cervix is not dilating and your water has not broken. If there are other health problems associated with your pregnancy, it is completely safe for you to be sent home with false labor. You may continue to have Braxton Hicks contractions until you go into true labor. How to tell the difference between true labor and false labor True labor  Contractions last 30-70 seconds.  Contractions become very regular.  Discomfort is usually felt in the top of the uterus, and it spreads to the lower abdomen and low back.  Contractions do not go away with walking.  Contractions usually become more intense and increase in frequency.  The cervix dilates and gets thinner. False labor  Contractions are usually shorter and not as strong as true labor contractions.  Contractions are usually irregular.  Contractions are often felt in the front of the lower abdomen and in the groin.  Contractions may go away when you walk around or change positions while lying down.  Contractions get weaker and are shorter-lasting as time goes on.  The cervix usually does not dilate or become thin. Follow these instructions at home:  Take over-the-counter and prescription medicines only as told by your health care provider.  Keep up with your usual exercises and follow other instructions from your health care provider.  Eat and drink lightly if you think you are going into labor.  If Braxton Hicks contractions are making you uncomfortable: ? Change your position from lying down  or resting to walking, or change from walking to resting. ? Sit and rest in a tub of warm water. ? Drink enough fluid to keep your urine pale yellow. Dehydration may cause these contractions. ? Do slow and deep breathing several times an hour.  Keep all follow-up prenatal visits as told by your health care provider. This is important. Contact a health care provider if:  You have a fever.  You have continuous pain in  your abdomen. Get help right away if:  Your contractions become stronger, more regular, and closer together.  You have fluid leaking or gushing from your vagina.  You pass blood-tinged mucus (bloody show).  You have bleeding from your vagina.  You have low back pain that you never had before.  You feel your baby's head pushing down and causing pelvic pressure.  Your baby is not moving inside you as much as it used to. Summary  Contractions that occur before labor are called Braxton Hicks contractions, false labor, or practice contractions.  Braxton Hicks contractions are usually shorter, weaker, farther apart, and less regular than true labor contractions. True labor contractions usually become progressively stronger and regular and they become more frequent.  Manage discomfort from [facility] contractions by changing position, resting in a warm bath, drinking plenty of water, or practicing deep breathing. This information is not intended to replace advice given to you by your health care provider. Make sure you discuss any questions you have with your health care provider. Document Released: 10/26/2016 Document Revised: 10/26/2016 Document Reviewed: 10/26/2016 Elsevier Interactive Patient Education  2018 [employer].

## 2018-04-19 ENCOUNTER — Encounter: Payer: Self-pay | Admitting: Obstetrics & Gynecology

## 2018-04-19 ENCOUNTER — Ambulatory Visit (INDEPENDENT_AMBULATORY_CARE_PROVIDER_SITE_OTHER): Payer: Medicaid Other | Admitting: Obstetrics & Gynecology

## 2018-04-19 ENCOUNTER — Other Ambulatory Visit: Payer: Self-pay

## 2018-04-19 VITALS — BP 127/82 | HR 85 | Wt 258.0 lb

## 2018-04-19 DIAGNOSIS — Z3A39 39 weeks gestation of pregnancy: Secondary | ICD-10-CM

## 2018-04-19 DIAGNOSIS — Z331 Pregnant state, incidental: Secondary | ICD-10-CM

## 2018-04-19 DIAGNOSIS — Z1389 Encounter for screening for other disorder: Secondary | ICD-10-CM

## 2018-04-19 DIAGNOSIS — Z3483 Encounter for supervision of other normal pregnancy, third trimester: Secondary | ICD-10-CM

## 2018-04-19 LAB — POCT URINALYSIS DIPSTICK OB
Blood, UA: NEGATIVE
GLUCOSE, UA: NEGATIVE
Ketones, UA: NEGATIVE
LEUKOCYTES UA: NEGATIVE
Nitrite, UA: NEGATIVE
POC,PROTEIN,UA: NEGATIVE

## 2018-04-19 NOTE — Progress Notes (Signed)
   LOW-RISK PREGNANCY VISIT Patient name: Carolyn Coffey MRN [id number]  Date of birth: Dec 26, 1990 Chief Complaint:   Routine Prenatal Visit  History of Present Illness:   Carolyn Coffey is a 27 y.o. [id number] female at [redacted]w[redacted]d with an Estimated Date of Delivery: 04/25/18 being seen today for ongoing management of a low-risk pregnancy.  Today she reports backache. Contractions: Irregular. Vag. Bleeding: None.  Movement: Present. denies leaking of fluid. Review of Systems:   Pertinent items are noted in HPI Denies abnormal vaginal discharge w/ itching/odor/irritation, headaches, visual changes, shortness of breath, chest pain, abdominal pain, severe nausea/vomiting, or problems with urination or bowel movements unless otherwise stated above. Pertinent History Reviewed:  Reviewed past medical,surgical, social, obstetrical and family history.  Reviewed problem list, medications and allergies. Physical Assessment:   Vitals:   04/19/18 1205  BP: 127/82  Pulse: 85  Weight: 258 lb (117 kg)  Body mass index is 37.02 kg/m.        Physical Examination:   General appearance: Well appearing, and in no distress  Mental status: Alert, oriented to person, place, and time  Skin: Warm & dry  Cardiovascular: Normal heart rate noted  Respiratory: Normal respiratory effort, no distress  Abdomen: Soft, gravid, nontender  Pelvic: Cervical exam performed       2/thick/ball/vertex/soft  Extremities: Edema: None  Fetal Status:     Movement: Present    Results for orders placed or performed in visit on 04/19/18 (from the past 24 hour(s))  POC Urinalysis Dipstick OB   Collection Time: 04/19/18 12:05 PM  Result Value Ref Range   Color, UA     Clarity, UA     Glucose, UA Negative Negative   Bilirubin, UA     Ketones, UA neg    Spec Grav, UA     Blood, UA neg    pH, UA     POC Protein UA Negative Negative, Trace   Urobilinogen, UA     Nitrite, UA neg    Leukocytes, UA Negative Negative   Appearance     Odor      Assessment & Plan:  1) Low-risk pregnancy [id number] at [redacted]w[redacted]d with an Estimated Date of Delivery: 04/25/18   2) S/P ECV successful   Meds: No orders of the defined types were placed in this encounter.  Labs/procedures today:   Plan:  Continue routine obstetrical care   Reviewed: Term labor symptoms and general obstetric precautions including but not limited to vaginal bleeding, contractions, leaking of fluid and fetal movement were reviewed in detail with the patient.  All questions were answered  Follow-up: Return in about 1 week (around 04/26/2018) for LROB.  Orders Placed This Encounter  Procedures  . POC Urinalysis Dipstick OB   Lazaro Arms  04/19/2018 12:17 PM

## 2018-04-25 ENCOUNTER — Encounter: Payer: Self-pay | Admitting: Family Medicine

## 2018-04-25 ENCOUNTER — Ambulatory Visit (INDEPENDENT_AMBULATORY_CARE_PROVIDER_SITE_OTHER): Payer: Medicaid Other | Admitting: Family Medicine

## 2018-04-25 ENCOUNTER — Telehealth (HOSPITAL_COMMUNITY): Payer: Self-pay | Admitting: *Deleted

## 2018-04-25 VITALS — BP 121/86 | HR 87 | Wt 263.6 lb

## 2018-04-25 DIAGNOSIS — O26893 Other specified pregnancy related conditions, third trimester: Secondary | ICD-10-CM

## 2018-04-25 DIAGNOSIS — O26899 Other specified pregnancy related conditions, unspecified trimester: Secondary | ICD-10-CM

## 2018-04-25 DIAGNOSIS — Z1389 Encounter for screening for other disorder: Secondary | ICD-10-CM

## 2018-04-25 DIAGNOSIS — O0930 Supervision of pregnancy with insufficient antenatal care, unspecified trimester: Secondary | ICD-10-CM

## 2018-04-25 DIAGNOSIS — O360130 Maternal care for anti-D [Rh] antibodies, third trimester, not applicable or unspecified: Secondary | ICD-10-CM

## 2018-04-25 DIAGNOSIS — A6000 Herpesviral infection of urogenital system, unspecified: Secondary | ICD-10-CM

## 2018-04-25 DIAGNOSIS — Z3A40 40 weeks gestation of pregnancy: Secondary | ICD-10-CM

## 2018-04-25 DIAGNOSIS — Z331 Pregnant state, incidental: Secondary | ICD-10-CM

## 2018-04-25 DIAGNOSIS — Z3483 Encounter for supervision of other normal pregnancy, third trimester: Secondary | ICD-10-CM

## 2018-04-25 DIAGNOSIS — Z6791 Unspecified blood type, Rh negative: Secondary | ICD-10-CM

## 2018-04-25 LAB — POCT URINALYSIS DIPSTICK OB
Glucose, UA: NEGATIVE
Ketones, UA: NEGATIVE
LEUKOCYTES UA: NEGATIVE
NITRITE UA: NEGATIVE
PROTEIN: NEGATIVE
RBC UA: NEGATIVE

## 2018-04-25 NOTE — Progress Notes (Signed)
Induction Assessment Scheduling Form Fax to [facility]:  [phone]  Carolyn Coffey                                                                                   DOB:  September 14, 1990                                                            MRN:  [id number]                                                                                       Provider:  [facility]  GP:  [id number]                                                            Estimated Date of Delivery: 04/25/18  Dating Criteria: LMP    Medical Indications for induction:  postdates Admission Date/Time:  05/03/2018 Gestational age on admission:  [redacted]w[redacted]d   There were no vitals filed for this visit. HIV:  Non Reactive (08/12 0904) GBS: Negative (10/10 1640)  2 cm dilated, 50 effaced, -3 station   Method of induction(proposed):  Cytotec   Scheduling Provider Signature:  Federico Flake, MD                                            Today's Date:  04/25/2018

## 2018-04-25 NOTE — Telephone Encounter (Signed)
Preadmission screen  

## 2018-04-25 NOTE — Progress Notes (Signed)
    PRENATAL VISIT NOTE  Subjective:  Carolyn Coffey is a 27 y.o. [id number] at [redacted]w[redacted]d being seen today for ongoing prenatal care.  She is currently monitored for the following issues for this low-risk pregnancy and has Supervision of normal pregnancy; Late prenatal care; Genital herpes; ASCUS with positive high risk HPV cervical; Rh negative state in antepartum period; and Breech presentation, antepartum on their problem list.  Patient reports no complaints.  Contractions: Irregular. Vag. Bleeding: None.  Movement: Present. Denies leaking of fluid.   The following portions of the patient's history were reviewed and updated as appropriate: allergies, current medications, past family history, past medical history, past social history, past surgical history and problem list. Problem list updated.  Objective:   Vitals:   04/25/18 1553  BP: 121/86  Pulse: 87  Weight: 263 lb 9.6 oz (119.6 kg)    Fetal Status: Fetal Heart Rate (bpm): 145 Fundal Height: 39 cm Movement: Present  Presentation: Vertex  General:  Alert, oriented and cooperative. Patient is in no acute distress.  Skin: Skin is warm and dry. No rash noted.   Cardiovascular: Normal heart rate noted  Respiratory: Normal respiratory effort, no problems with respiration noted  Abdomen: Soft, gravid, appropriate for gestational age.  Pain/Pressure: Present     Pelvic: Cervical exam deferred Dilation: 2.5 Effacement (%): 50 Station: Ballotable  Extremities: Normal range of motion.  Edema: None  Mental Status: Normal mood and affect. Normal behavior. Normal judgment and thought content.   Assessment and Plan:  Pregnancy: [id number] at [redacted]w[redacted]d  1. Pregnant state, incidental - POC Urinalysis Dipstick OB  2. Encounter for supervision of other normal pregnancy in third trimester UTD Vertex presentation today Discussed IOL at 41 weeks and scheduled today Will return to [facility] for visit and [facility] on 11/7 given IOl is after 41wk  3. Genital  herpes simplex, unspecified site complaint with medication  4. Late prenatal care  5. Rh negative state in antepartum period Received rhogam on 02/27/2018   Term labor symptoms and general obstetric precautions including but not limited to vaginal bleeding, contractions, leaking of fluid and fetal movement were reviewed in detail with the patient. Please refer to After Visit Summary for other counseling recommendations.  Return in about 1 week (around 05/02/2018) for Routine prenatal care with NST.  Future Appointments  Date Time Provider Department Center  05/03/2018  7:30 AM [facility] RO[facility] None    Federico Flake, MD

## 2018-04-25 NOTE — Patient Instructions (Signed)
Come to the MAU (maternity admission unit) for 1) Strong contractions every 2-3 minutes for at least 1 hour that do not go away when you drink water or take a warm shower. These contractions will be so strong all you can do is breath through them 2) Vaginal bleeding- anything more than spotting 3) Loss of fluid like you broke your water 4) Decreased movement of your baby  

## 2018-05-02 ENCOUNTER — Other Ambulatory Visit: Payer: Medicaid Other | Admitting: Advanced Practice Midwife

## 2018-05-03 ENCOUNTER — Inpatient Hospital Stay (HOSPITAL_COMMUNITY): Admission: RE | Admit: 2018-05-03 | Payer: Medicaid Other | Source: Ambulatory Visit

## 2018-05-13 ENCOUNTER — Encounter: Payer: Medicaid Other | Admitting: Obstetrics & Gynecology

## 2018-05-14 ENCOUNTER — Telehealth: Payer: Self-pay | Admitting: *Deleted

## 2018-05-14 ENCOUNTER — Ambulatory Visit: Payer: Medicaid Other | Admitting: Obstetrics & Gynecology

## 2018-05-14 ENCOUNTER — Ambulatory Visit (INDEPENDENT_AMBULATORY_CARE_PROVIDER_SITE_OTHER): Payer: Medicaid Other | Admitting: Obstetrics & Gynecology

## 2018-05-14 ENCOUNTER — Encounter: Payer: Self-pay | Admitting: Obstetrics & Gynecology

## 2018-05-14 VITALS — BP 135/83 | HR 78 | Ht 70.0 in | Wt 249.0 lb

## 2018-05-14 DIAGNOSIS — Z98891 History of uterine scar from previous surgery: Secondary | ICD-10-CM

## 2018-05-14 NOTE — Progress Notes (Signed)
  HPI: Patient returns for routine postoperative follow-up having undergone primary C section on 05/01/2018.  The patient's immediate postoperative recovery has been unremarkable. Since hospital discharge the patient reports normal post op course.   Current Outpatient Medications: ibuprofen (ADVIL,MOTRIN) 400 MG tablet, Take 800 mg by mouth as needed., Disp: , Rfl:  IRON PO, Take by mouth 2 (two) times daily., Disp: , Rfl:   No current facility-administered medications for this visit.     Blood pressure 135/83, pulse 78, height 5\' 10"  (1.778 m), weight 249 lb (112.9 kg), last menstrual period 09/18/2017, not currently breastfeeding.  Physical Exam: Incision clean dry intact Healing well  Diagnostic Tests:   Pathology: n/a  Impression: S/p primary C section for breech at [facility]  Plan:   Follow up: 4  weeks pp visit  Lazaro ArmsLuther H Adison Jerger, MD

## 2018-06-06 ENCOUNTER — Ambulatory Visit (INDEPENDENT_AMBULATORY_CARE_PROVIDER_SITE_OTHER): Payer: Medicaid Other | Admitting: Advanced Practice Midwife

## 2018-06-06 ENCOUNTER — Encounter: Payer: Self-pay | Admitting: Advanced Practice Midwife

## 2018-06-06 NOTE — Progress Notes (Signed)
Carolyn Coffey GoldsChristina Coffey is a 27 y.o. who presents for a postpartum visit. She is 5 weeks postpartum following a low cervical transverse Cesarean section. I have fully reviewed the prenatal and intrapartum course. The delivery [facility] at 40.6  gestational weeks, [facility]. Anesthesia: spinal. Postpartum course has been uneventful. Baby's course has been uneventful. Baby is feeding by bottle. Bleeding: no bleeding. Bowel function is normal. Bladder function is normal. Patient is sexually active. Contraception method is condoms. Postpartum depression screening: 15 (borderline high) .Hx anxiety..  Not depresses, just  Denies SI/HI. Offered BH referral and accepted.    Current Outpatient Medications:  .  IRON PO, Take by mouth 2 (two) times daily., Disp: , Rfl:  .  ibuprofen (ADVIL,MOTRIN) 400 MG tablet, Take 800 mg by mouth as needed., Disp: , Rfl:   Review of Systems   Constitutional: Negative for fever and chills Eyes: Negative for visual disturbances Respiratory: Negative for shortness of breath, dyspnea Cardiovascular: Negative for chest pain or palpitations  Gastrointestinal: Negative for vomiting, diarrhea and constipation Genitourinary: Negative for dysuria and urgency Musculoskeletal: Negative for back pain, joint pain, myalgias  Neurological: Negative for dizziness and headaches    Objective:     Vitals:   06/06/18 1534  BP: 113/79  Pulse: 69   General:  alert, cooperative and no distress   Breasts:  negative  Lungs: Normal respiratory effort  Heart:  regular rate and rhythm  Abdomen: Soft, nontender;a little yeasty on left side, painted w/gentian violet   Vulva:  normal  Vagina: normal vagina  Cervix:  closed  Corpus: Well involuted     Rectal Exam: no hemorrhoids        Assessment:    normal postpartum exam.  Plan:   1. Contraception: Nexplanon  Referral [facility] to [facility] 2. Follow up in:  Next week for nexplanon-no sex until then.  or as needed.

## 2018-06-12 ENCOUNTER — Encounter: Payer: Self-pay | Admitting: Advanced Practice Midwife

## 2018-06-12 ENCOUNTER — Other Ambulatory Visit: Payer: Self-pay

## 2018-06-12 ENCOUNTER — Ambulatory Visit (INDEPENDENT_AMBULATORY_CARE_PROVIDER_SITE_OTHER): Payer: Medicaid Other | Admitting: Advanced Practice Midwife

## 2018-06-12 VITALS — BP 120/76 | HR 70 | Ht 70.0 in | Wt 247.0 lb

## 2018-06-12 DIAGNOSIS — Z30017 Encounter for initial prescription of implantable subdermal contraceptive: Secondary | ICD-10-CM

## 2018-06-12 DIAGNOSIS — Z3049 Encounter for surveillance of other contraceptives: Secondary | ICD-10-CM | POA: Diagnosis not present

## 2018-06-12 DIAGNOSIS — Z3202 Encounter for pregnancy test, result negative: Secondary | ICD-10-CM

## 2018-06-12 LAB — POCT URINE PREGNANCY: Preg Test, Ur: NEGATIVE

## 2018-06-12 MED ORDER — ETONOGESTREL 68 MG ~~LOC~~ IMPL
68.0000 mg | DRUG_IMPLANT | Freq: Once | SUBCUTANEOUS | Status: AC
  Administered 2018-06-12: 68 mg via SUBCUTANEOUS

## 2018-06-12 NOTE — Progress Notes (Signed)
  HPI:  Carolyn Coffey is a 27 y.o. year old Caucasian female here for Nexplanon insertion.  Her pregnancy test today was negative.  Risks/benefits/side effects of Nexplanon have been discussed and her questions have been answered.  Specifically, a failure rate of 06/998 has been reported, with an increased failure rate if pt takes St. John's Wort and/or antiseizure medicaitons.  Carolyn Coffey is aware of the common side effect of irregular bleeding, which the incidence of decreases over time.   Past Medical History: Past Medical History:  Diagnosis Date  . Genital herpes   . Mental disorder    anxiety    Past Surgical History: Past Surgical History:  Procedure Laterality Date  . CESAREAN SECTION    . NO PAST SURGERIES      Family History: Family History  Problem Relation Age of Onset  . Cancer Paternal Grandfather        skin  . Hypertension Paternal Grandmother   . Hypertension Maternal Grandmother   . Diabetes Maternal Grandfather   . Colon cancer Father   . Turner syndrome Daughter     Social History: Social History   Tobacco Use  . Smoking status: Former Smoker    Types: Cigarettes  . Smokeless tobacco: Never Used  Substance Use Topics  . Alcohol use: No  . Drug use: No    Allergies: No Known Allergies    Her left arm, approximatly 4 inches proximal from the elbow, was cleansed with alcohol and anesthetized with 2cc of 2% Lidocaine.  The area was cleansed again and the Nexplanon was inserted without difficulty.  A pressure bandage was applied.  Pt was instructed to remove pressure bandage in a few hours, and keep insertion site covered with a bandaid for 3 days.  Back up contraception was recommended for 2 weeks.  Follow-up scheduled PRN problems  Carolyn Coffey 06/12/2018 2:55 PM

## 2018-08-26 NOTE — Telephone Encounter (Signed)
Error
